# Patient Record
Sex: Female | Born: 1980 | Race: White | Hispanic: No | Smoking: Never smoker
Health system: Southern US, Community
[De-identification: ages and names within clinical notes are randomized; demographics above are authoritative.]

## PROBLEM LIST (undated history)

## (undated) DIAGNOSIS — K219 Gastro-esophageal reflux disease without esophagitis: Secondary | ICD-10-CM

## (undated) DIAGNOSIS — R030 Elevated blood-pressure reading, without diagnosis of hypertension: Secondary | ICD-10-CM

## (undated) DIAGNOSIS — IMO0001 Reserved for inherently not codable concepts without codable children: Secondary | ICD-10-CM

## (undated) DIAGNOSIS — G8929 Other chronic pain: Secondary | ICD-10-CM

## (undated) DIAGNOSIS — R51 Headache: Secondary | ICD-10-CM

## (undated) HISTORY — DX: Other chronic pain: G89.29

## (undated) HISTORY — DX: Headache: R51

## (undated) HISTORY — PX: TONSILLECTOMY: SUR1361

## (undated) HISTORY — PX: ADENOIDECTOMY: SUR15

## (undated) HISTORY — DX: Reserved for inherently not codable concepts without codable children: IMO0001

## (undated) HISTORY — DX: Elevated blood-pressure reading, without diagnosis of hypertension: R03.0

---

## 2013-08-18 ENCOUNTER — Other Ambulatory Visit: Payer: Self-pay | Admitting: Neurosurgery

## 2013-09-16 ENCOUNTER — Encounter (HOSPITAL_COMMUNITY): Payer: Self-pay | Admitting: Pharmacy Technician

## 2013-09-19 ENCOUNTER — Encounter (HOSPITAL_COMMUNITY): Payer: Self-pay

## 2013-09-19 ENCOUNTER — Encounter (HOSPITAL_COMMUNITY)
Admission: RE | Admit: 2013-09-19 | Discharge: 2013-09-19 | Disposition: A | Discharge status: Home / Self Care | Source: Ambulatory Visit | Attending: Neurosurgery | Admitting: Neurosurgery

## 2013-09-19 DIAGNOSIS — Z01812 Encounter for preprocedural laboratory examination: Secondary | ICD-10-CM | POA: Diagnosis not present

## 2013-09-19 HISTORY — DX: Gastro-esophageal reflux disease without esophagitis: K21.9

## 2013-09-19 LAB — CBC
HEMATOCRIT: 39.6 % (ref 36.0–46.0)
Hemoglobin: 13.3 g/dL (ref 12.0–15.0)
MCH: 29.1 pg (ref 26.0–34.0)
MCHC: 33.6 g/dL (ref 30.0–36.0)
MCV: 86.7 fL (ref 78.0–100.0)
PLATELETS: 222 10*3/uL (ref 150–400)
RBC: 4.57 MIL/uL (ref 3.87–5.11)
RDW: 13 % (ref 11.5–15.5)
WBC: 7 10*3/uL (ref 4.0–10.5)

## 2013-09-19 LAB — SURGICAL PCR SCREEN
MRSA, PCR: NEGATIVE
Staphylococcus aureus: NEGATIVE

## 2013-09-19 LAB — HCG, SERUM, QUALITATIVE: Preg, Serum: NEGATIVE

## 2013-09-19 NOTE — Pre-Procedure Instructions (Signed)
Melissa [name] ZOXWRUEundiff  09/19/2013   Your procedure is scheduled on:  September 30, 2013  [facility] Admitting at 5:30 AM.  Call this number if you have problems the morning of surgery: [phone]   Remember:   Do not eat food or drink liquids after midnight.   Take these medicines the morning of surgery with A SIP OF WATER: None   STOP HERBAL SUPPLEMENTS, NSAIDS (IBUPROFEN) ONE WEEK PRIOR TO SURGERY  STOP MEDICATIONS CONTAINING PSEUDOPHEDRINE (CLARITIN D 24)  2 WEEKS PRIOR TO SURGERY   Do not wear jewelry, make-up or nail polish.  Do not wear lotions, powders, or perfumes. You may wear deodorant.  Do not shave 48 hours prior to surgery.   Do not bring valuables to the hospital.  [facility] is not responsible for any belongings or valuables.               Contacts, dentures or bridgework may not be worn into surgery.  Leave suitcase in the car. After surgery it may be brought to your room.  For patients admitted to the hospital, discharge time is determined by your                treatment team.               Patients discharged the day of surgery will not be allowed to drive  home.  Name and phone number of your driver:     Please read over the following fact sheets that you were given: Pain Booklet, Coughing and Deep Breathing and Surgical Site Infection Prevention

## 2013-09-29 MED ORDER — CEFAZOLIN SODIUM-DEXTROSE 2-3 GM-% IV SOLR
2.0000 g | INTRAVENOUS | Status: AC
  Administered 2013-09-30: 2 g via INTRAVENOUS
  Filled 2013-09-29: qty 50

## 2013-09-30 ENCOUNTER — Encounter (HOSPITAL_COMMUNITY): Admitting: Anesthesiology

## 2013-09-30 ENCOUNTER — Encounter (HOSPITAL_COMMUNITY): Payer: Self-pay | Admitting: *Deleted

## 2013-09-30 ENCOUNTER — Ambulatory Visit (HOSPITAL_COMMUNITY)

## 2013-09-30 ENCOUNTER — Observation Stay (HOSPITAL_COMMUNITY)
Admission: RE | Admit: 2013-09-30 | Discharge: 2013-09-30 | Disposition: A | Discharge status: Home / Self Care | Source: Ambulatory Visit | Attending: Neurosurgery | Admitting: Neurosurgery

## 2013-09-30 ENCOUNTER — Encounter (HOSPITAL_COMMUNITY)
Admission: RE | Disposition: A | Discharge status: Home / Self Care | Payer: Self-pay | Source: Ambulatory Visit | Attending: Neurosurgery

## 2013-09-30 ENCOUNTER — Ambulatory Visit (HOSPITAL_COMMUNITY): Admitting: Anesthesiology

## 2013-09-30 DIAGNOSIS — M5010 Cervical disc disorder with radiculopathy, unspecified cervical region: Secondary | ICD-10-CM | POA: Diagnosis present

## 2013-09-30 DIAGNOSIS — M4712 Other spondylosis with myelopathy, cervical region: Secondary | ICD-10-CM | POA: Diagnosis present

## 2013-09-30 DIAGNOSIS — M5020 Other cervical disc displacement, unspecified cervical region: Secondary | ICD-10-CM | POA: Insufficient documentation

## 2013-09-30 DIAGNOSIS — K219 Gastro-esophageal reflux disease without esophagitis: Secondary | ICD-10-CM | POA: Insufficient documentation

## 2013-09-30 DIAGNOSIS — M47812 Spondylosis without myelopathy or radiculopathy, cervical region: Secondary | ICD-10-CM | POA: Diagnosis not present

## 2013-09-30 HISTORY — PX: CERVICAL DISC ARTHROPLASTY: SHX587

## 2013-09-30 SURGERY — CERVICAL ANTERIOR DISC ARTHROPLASTY
Anesthesia: General | Site: Neck

## 2013-09-30 MED ORDER — MEPERIDINE HCL 25 MG/ML IJ SOLN: 6.2500 mg | INTRAMUSCULAR | Status: DC | PRN

## 2013-09-30 MED ORDER — 0.9 % SODIUM CHLORIDE (POUR BTL) OPTIME
TOPICAL | Status: DC | PRN
  Administered 2013-09-30: 1000 mL

## 2013-09-30 MED ORDER — ONDANSETRON HCL 4 MG/2ML IJ SOLN: 4.0000 mg | INTRAMUSCULAR | Status: DC | PRN

## 2013-09-30 MED ORDER — DOCUSATE SODIUM 100 MG PO CAPS
100.0000 mg | ORAL_CAPSULE | Freq: Two times a day (BID) | ORAL | Status: DC
  Administered 2013-09-30: 100 mg via ORAL
  Filled 2013-09-30 (×2): qty 1

## 2013-09-30 MED ORDER — PHENOL 1.4 % MT LIQD: 1.0000 | OROMUCOSAL | Status: DC | PRN

## 2013-09-30 MED ORDER — KETOROLAC TROMETHAMINE 30 MG/ML IJ SOLN: 30.0000 mg | Freq: Four times a day (QID) | INTRAMUSCULAR | Status: DC

## 2013-09-30 MED ORDER — HYDROMORPHONE HCL PF 1 MG/ML IJ SOLN
INTRAMUSCULAR | Status: AC
  Filled 2013-09-30: qty 1

## 2013-09-30 MED ORDER — LIDOCAINE HCL (CARDIAC) 20 MG/ML IV SOLN
INTRAVENOUS | Status: AC
  Filled 2013-09-30: qty 5

## 2013-09-30 MED ORDER — ARTIFICIAL TEARS OP OINT
TOPICAL_OINTMENT | OPHTHALMIC | Status: DC | PRN
  Administered 2013-09-30: 1 via OPHTHALMIC

## 2013-09-30 MED ORDER — KETOROLAC TROMETHAMINE 30 MG/ML IJ SOLN
30.0000 mg | Freq: Once | INTRAMUSCULAR | Status: AC
  Administered 2013-09-30: 30 mg via INTRAVENOUS

## 2013-09-30 MED ORDER — LIDOCAINE-EPINEPHRINE 1 %-1:100000 IJ SOLN
INTRAMUSCULAR | Status: DC | PRN
  Administered 2013-09-30: 4 mL

## 2013-09-30 MED ORDER — LIDOCAINE HCL (CARDIAC) 20 MG/ML IV SOLN
INTRAVENOUS | Status: DC | PRN
  Administered 2013-09-30: 100 mg via INTRAVENOUS

## 2013-09-30 MED ORDER — MIDAZOLAM HCL 5 MG/5ML IJ SOLN
INTRAMUSCULAR | Status: DC | PRN
  Administered 2013-09-30: 2 mg via INTRAVENOUS

## 2013-09-30 MED ORDER — SODIUM CHLORIDE 0.9 % IJ SOLN: 3.0000 mL | Freq: Two times a day (BID) | INTRAMUSCULAR | Status: DC

## 2013-09-30 MED ORDER — MORPHINE SULFATE 2 MG/ML IJ SOLN: 1.0000 mg | INTRAMUSCULAR | Status: DC | PRN

## 2013-09-30 MED ORDER — HYDROMORPHONE HCL PF 1 MG/ML IJ SOLN
0.2500 mg | INTRAMUSCULAR | Status: DC | PRN
  Administered 2013-09-30 (×2): 0.5 mg via INTRAVENOUS

## 2013-09-30 MED ORDER — HYDROCODONE-ACETAMINOPHEN 5-325 MG PO TABS: 1.0000 | ORAL_TABLET | ORAL | Status: DC | PRN

## 2013-09-30 MED ORDER — IBUPROFEN 800 MG PO TABS
800.0000 mg | ORAL_TABLET | Freq: Three times a day (TID) | ORAL | Status: DC | PRN
  Filled 2013-09-30: qty 1

## 2013-09-30 MED ORDER — DEXAMETHASONE SODIUM PHOSPHATE 10 MG/ML IJ SOLN
INTRAMUSCULAR | Status: AC
  Filled 2013-09-30: qty 1

## 2013-09-30 MED ORDER — DIAZEPAM 5 MG PO TABS: 5.0000 mg | ORAL_TABLET | Freq: Four times a day (QID) | ORAL | Status: DC | PRN

## 2013-09-30 MED ORDER — ARTIFICIAL TEARS OP OINT
TOPICAL_OINTMENT | OPHTHALMIC | Status: AC
  Filled 2013-09-30: qty 3.5

## 2013-09-30 MED ORDER — OXYCODONE HCL 5 MG/5ML PO SOLN: 5.0000 mg | Freq: Once | ORAL | Status: DC | PRN

## 2013-09-30 MED ORDER — MIDAZOLAM HCL 2 MG/2ML IJ SOLN
INTRAMUSCULAR | Status: AC
  Filled 2013-09-30: qty 2

## 2013-09-30 MED ORDER — GLYCOPYRROLATE 0.2 MG/ML IJ SOLN
INTRAMUSCULAR | Status: DC | PRN
  Administered 2013-09-30: 0.6 mg via INTRAVENOUS

## 2013-09-30 MED ORDER — HYDROMORPHONE HCL PF 1 MG/ML IJ SOLN
INTRAMUSCULAR | Status: AC
  Administered 2013-09-30: 0.5 mg via INTRAVENOUS
  Filled 2013-09-30: qty 1

## 2013-09-30 MED ORDER — SENNA 8.6 MG PO TABS: 1.0000 | ORAL_TABLET | Freq: Two times a day (BID) | ORAL | Status: DC

## 2013-09-30 MED ORDER — CEFAZOLIN SODIUM 1-5 GM-% IV SOLN
1.0000 g | Freq: Three times a day (TID) | INTRAVENOUS | Status: DC
  Administered 2013-09-30: 1 g via INTRAVENOUS
  Filled 2013-09-30: qty 50

## 2013-09-30 MED ORDER — PHENYLEPHRINE 40 MCG/ML (10ML) SYRINGE FOR IV PUSH (FOR BLOOD PRESSURE SUPPORT)
PREFILLED_SYRINGE | INTRAVENOUS | Status: AC
  Filled 2013-09-30: qty 10

## 2013-09-30 MED ORDER — HEMOSTATIC AGENTS (NO CHARGE) OPTIME
TOPICAL | Status: DC | PRN
  Administered 2013-09-30: 1 via TOPICAL

## 2013-09-30 MED ORDER — SENNOSIDES-DOCUSATE SODIUM 8.6-50 MG PO TABS
1.0000 | ORAL_TABLET | Freq: Every evening | ORAL | Status: DC | PRN
  Filled 2013-09-30: qty 1

## 2013-09-30 MED ORDER — GLYCOPYRROLATE 0.2 MG/ML IJ SOLN
INTRAMUSCULAR | Status: AC
  Filled 2013-09-30: qty 3

## 2013-09-30 MED ORDER — ONDANSETRON HCL 4 MG/2ML IJ SOLN: 4.0000 mg | Freq: Once | INTRAMUSCULAR | Status: DC | PRN

## 2013-09-30 MED ORDER — DEXAMETHASONE SODIUM PHOSPHATE 10 MG/ML IJ SOLN
INTRAMUSCULAR | Status: DC | PRN
  Administered 2013-09-30: 10 mg via INTRAVENOUS

## 2013-09-30 MED ORDER — LORATADINE 10 MG PO TABS
10.0000 mg | ORAL_TABLET | Freq: Every day | ORAL | Status: DC
  Administered 2013-09-30: 10 mg via ORAL
  Filled 2013-09-30: qty 1

## 2013-09-30 MED ORDER — FENTANYL CITRATE 0.05 MG/ML IJ SOLN
INTRAMUSCULAR | Status: AC
  Filled 2013-09-30: qty 5

## 2013-09-30 MED ORDER — SUCCINYLCHOLINE CHLORIDE 20 MG/ML IJ SOLN
INTRAMUSCULAR | Status: AC
  Filled 2013-09-30: qty 1

## 2013-09-30 MED ORDER — PROPOFOL 10 MG/ML IV BOLUS
INTRAVENOUS | Status: DC | PRN
  Administered 2013-09-30: 120 mg via INTRAVENOUS

## 2013-09-30 MED ORDER — EPHEDRINE SULFATE 50 MG/ML IJ SOLN
INTRAMUSCULAR | Status: AC
  Filled 2013-09-30: qty 1

## 2013-09-30 MED ORDER — OXYCODONE-ACETAMINOPHEN 5-325 MG PO TABS
ORAL_TABLET | ORAL | Status: AC
  Filled 2013-09-30: qty 2

## 2013-09-30 MED ORDER — SODIUM CHLORIDE 0.9 % IJ SOLN
INTRAMUSCULAR | Status: AC
  Filled 2013-09-30: qty 10

## 2013-09-30 MED ORDER — B COMPLEX-C PO TABS
2.0000 | ORAL_TABLET | Freq: Every day | ORAL | Status: DC
  Administered 2013-09-30: 2 via ORAL
  Filled 2013-09-30: qty 2

## 2013-09-30 MED ORDER — ALUM & MAG HYDROXIDE-SIMETH 200-200-20 MG/5ML PO SUSP: 30.0000 mL | Freq: Four times a day (QID) | ORAL | Status: DC | PRN

## 2013-09-30 MED ORDER — FENTANYL CITRATE 0.05 MG/ML IJ SOLN
INTRAMUSCULAR | Status: DC | PRN
  Administered 2013-09-30: 100 ug via INTRAVENOUS
  Administered 2013-09-30: 50 ug via INTRAVENOUS
  Administered 2013-09-30: 100 ug via INTRAVENOUS

## 2013-09-30 MED ORDER — OXYCODONE HCL 5 MG PO TABS: 5.0000 mg | ORAL_TABLET | Freq: Once | ORAL | Status: DC | PRN

## 2013-09-30 MED ORDER — ONDANSETRON HCL 4 MG/2ML IJ SOLN
INTRAMUSCULAR | Status: DC | PRN
  Administered 2013-09-30: 4 mg via INTRAVENOUS

## 2013-09-30 MED ORDER — PROPOFOL 10 MG/ML IV BOLUS
INTRAVENOUS | Status: AC
  Filled 2013-09-30: qty 20

## 2013-09-30 MED ORDER — SODIUM CHLORIDE 0.9 % IV SOLN: 250.0000 mL | INTRAVENOUS | Status: DC

## 2013-09-30 MED ORDER — KETOROLAC TROMETHAMINE 30 MG/ML IJ SOLN
INTRAMUSCULAR | Status: AC
  Administered 2013-09-30: 30 mg via INTRAVENOUS
  Filled 2013-09-30: qty 1

## 2013-09-30 MED ORDER — BUPIVACAINE HCL (PF) 0.25 % IJ SOLN
INTRAMUSCULAR | Status: DC | PRN
  Administered 2013-09-30: 4 mL

## 2013-09-30 MED ORDER — NEOSTIGMINE METHYLSULFATE 10 MG/10ML IV SOLN
INTRAVENOUS | Status: DC | PRN
  Administered 2013-09-30: 4 mg via INTRAVENOUS

## 2013-09-30 MED ORDER — ROCURONIUM BROMIDE 100 MG/10ML IV SOLN
INTRAVENOUS | Status: DC | PRN
  Administered 2013-09-30: 50 mg via INTRAVENOUS

## 2013-09-30 MED ORDER — PANTOPRAZOLE SODIUM 40 MG IV SOLR
40.0000 mg | Freq: Every day | INTRAVENOUS | Status: DC
  Filled 2013-09-30: qty 40

## 2013-09-30 MED ORDER — FLEET ENEMA 7-19 GM/118ML RE ENEM
1.0000 | ENEMA | Freq: Once | RECTAL | Status: DC | PRN
  Filled 2013-09-30: qty 1

## 2013-09-30 MED ORDER — MENTHOL 3 MG MT LOZG: 1.0000 | LOZENGE | OROMUCOSAL | Status: DC | PRN

## 2013-09-30 MED ORDER — NEOSTIGMINE METHYLSULFATE 10 MG/10ML IV SOLN
INTRAVENOUS | Status: AC
  Filled 2013-09-30: qty 1

## 2013-09-30 MED ORDER — LACTATED RINGERS IV SOLN
INTRAVENOUS | Status: DC | PRN
  Administered 2013-09-30 (×2): via INTRAVENOUS

## 2013-09-30 MED ORDER — SODIUM CHLORIDE 0.9 % IJ SOLN
3.0000 mL | INTRAMUSCULAR | Status: DC | PRN
Start: 2013-09-30 — End: 2013-09-30

## 2013-09-30 MED ORDER — BISACODYL 10 MG RE SUPP: 10.0000 mg | Freq: Every day | RECTAL | Status: DC | PRN

## 2013-09-30 MED ORDER — ZOLPIDEM TARTRATE 5 MG PO TABS: 5.0000 mg | ORAL_TABLET | Freq: Every evening | ORAL | Status: DC | PRN

## 2013-09-30 MED ORDER — ONDANSETRON HCL 4 MG/2ML IJ SOLN
INTRAMUSCULAR | Status: AC
  Filled 2013-09-30: qty 2

## 2013-09-30 MED ORDER — THROMBIN 5000 UNITS EX SOLR
CUTANEOUS | Status: DC | PRN
  Administered 2013-09-30 (×2): 5000 [IU] via TOPICAL

## 2013-09-30 MED ORDER — KCL IN DEXTROSE-NACL 20-5-0.45 MEQ/L-%-% IV SOLN
INTRAVENOUS | Status: DC
  Filled 2013-09-30 (×2): qty 1000

## 2013-09-30 MED ORDER — ROCURONIUM BROMIDE 50 MG/5ML IV SOLN
INTRAVENOUS | Status: AC
  Filled 2013-09-30: qty 1

## 2013-09-30 MED ORDER — OXYCODONE-ACETAMINOPHEN 5-325 MG PO TABS
1.0000 | ORAL_TABLET | ORAL | Status: DC | PRN
  Administered 2013-09-30 (×2): 2 via ORAL
  Filled 2013-09-30: qty 2

## 2013-09-30 MED ORDER — ACETAMINOPHEN 325 MG PO TABS: 650.0000 mg | ORAL_TABLET | ORAL | Status: DC | PRN

## 2013-09-30 MED ORDER — B COMPLEX PO TABS: 2.0000 | ORAL_TABLET | Freq: Every day | ORAL | Status: DC

## 2013-09-30 MED ORDER — ACETAMINOPHEN ER 650 MG PO TBCR: 650.0000 mg | EXTENDED_RELEASE_TABLET | Freq: Three times a day (TID) | ORAL | Status: DC | PRN

## 2013-09-30 MED ORDER — ACETAMINOPHEN 650 MG RE SUPP: 650.0000 mg | RECTAL | Status: DC | PRN

## 2013-09-30 SURGICAL SUPPLY — 70 items
BAG DECANTER FOR FLEXI CONT (MISCELLANEOUS) ×3 IMPLANT
BENZOIN TINCTURE PRP APPL 2/3 (GAUZE/BANDAGES/DRESSINGS) IMPLANT
BIT DRILL NEURO 2X3.1 SFT TUCH (MISCELLANEOUS) ×1 IMPLANT
BIT MILLING PRODISC 2.0 STER (BIT) ×6 IMPLANT
BLADE 10 SAFETY STRL DISP (BLADE) IMPLANT
BLADE ULTRA TIP 2M (BLADE) ×3 IMPLANT
BUR BARREL STRAIGHT FLUTE 4.0 (BURR) IMPLANT
CANISTER SUCT 3000ML (MISCELLANEOUS) ×3 IMPLANT
CLOSURE WOUND 1/2 X4 (GAUZE/BANDAGES/DRESSINGS)
CONT SPEC 4OZ CLIKSEAL STRL BL (MISCELLANEOUS) ×3 IMPLANT
DERMABOND ADHESIVE PROPEN (GAUZE/BANDAGES/DRESSINGS) ×2
DERMABOND ADVANCED (GAUZE/BANDAGES/DRESSINGS) ×2
DERMABOND ADVANCED .7 DNX12 (GAUZE/BANDAGES/DRESSINGS) ×1 IMPLANT
DERMABOND ADVANCED .7 DNX6 (GAUZE/BANDAGES/DRESSINGS) ×1 IMPLANT
DISC PRODISC-C MED DEEP 5MM (Neuro Prosthesis/Implant) ×3 IMPLANT
DRAPE C-ARM 42X72 X-RAY (DRAPES) IMPLANT
DRAPE LAPAROTOMY 100X72 PEDS (DRAPES) ×3 IMPLANT
DRAPE MICROSCOPE LEICA (MISCELLANEOUS) ×3 IMPLANT
DRAPE POUCH INSTRU U-SHP 10X18 (DRAPES) ×3 IMPLANT
DRAPE PROXIMA HALF (DRAPES) IMPLANT
DRILL NEURO 2X3.1 SOFT TOUCH (MISCELLANEOUS) ×3
DRSG OPSITE POSTOP 3X4 (GAUZE/BANDAGES/DRESSINGS) ×3 IMPLANT
DRSG TELFA 3X8 NADH (GAUZE/BANDAGES/DRESSINGS) IMPLANT
DURAPREP 6ML APPLICATOR 50/CS (WOUND CARE) ×3 IMPLANT
ELECT COATED BLADE 2.86 ST (ELECTRODE) ×3 IMPLANT
ELECT REM PT RETURN 9FT ADLT (ELECTROSURGICAL) ×3
ELECTRODE REM PT RTRN 9FT ADLT (ELECTROSURGICAL) ×1 IMPLANT
GAUZE SPONGE 4X4 16PLY XRAY LF (GAUZE/BANDAGES/DRESSINGS) IMPLANT
GLOVE BIO SURGEON STRL SZ8 (GLOVE) ×3 IMPLANT
GLOVE BIOGEL PI IND STRL 7.5 (GLOVE) ×1 IMPLANT
GLOVE BIOGEL PI IND STRL 8 (GLOVE) ×1 IMPLANT
GLOVE BIOGEL PI IND STRL 8.5 (GLOVE) ×1 IMPLANT
GLOVE BIOGEL PI INDICATOR 7.5 (GLOVE) ×2
GLOVE BIOGEL PI INDICATOR 8 (GLOVE) ×2
GLOVE BIOGEL PI INDICATOR 8.5 (GLOVE) ×2
GLOVE ECLIPSE 7.5 STRL STRAW (GLOVE) ×3 IMPLANT
GLOVE EXAM NITRILE LRG STRL (GLOVE) IMPLANT
GLOVE EXAM NITRILE MD LF STRL (GLOVE) IMPLANT
GLOVE EXAM NITRILE XL STR (GLOVE) IMPLANT
GLOVE EXAM NITRILE XS STR PU (GLOVE) IMPLANT
GLOVE SS N UNI LF 7.5 STRL (GLOVE) ×6 IMPLANT
GOWN STRL REUS W/ TWL LRG LVL3 (GOWN DISPOSABLE) IMPLANT
GOWN STRL REUS W/ TWL XL LVL3 (GOWN DISPOSABLE) ×2 IMPLANT
GOWN STRL REUS W/TWL 2XL LVL3 (GOWN DISPOSABLE) ×3 IMPLANT
GOWN STRL REUS W/TWL LRG LVL3 (GOWN DISPOSABLE)
GOWN STRL REUS W/TWL XL LVL3 (GOWN DISPOSABLE) ×4
KIT BASIN OR (CUSTOM PROCEDURE TRAY) ×3 IMPLANT
KIT ROOM TURNOVER OR (KITS) ×3 IMPLANT
NEEDLE HYPO 25X1 1.5 SAFETY (NEEDLE) ×3 IMPLANT
NEEDLE SPNL 22GX3.5 QUINCKE BK (NEEDLE) ×3 IMPLANT
NS IRRIG 1000ML POUR BTL (IV SOLUTION) ×3 IMPLANT
PACK LAMINECTOMY NEURO (CUSTOM PROCEDURE TRAY) ×3 IMPLANT
PAD ABD 8X10 STRL (GAUZE/BANDAGES/DRESSINGS) IMPLANT
PAD ARMBOARD 7.5X6 YLW CONV (MISCELLANEOUS) ×3 IMPLANT
PIN RETAINER PRODISC 14 MM (PIN) ×6 IMPLANT
Pro Disc Retainer Screw 14mm ×6 IMPLANT
RUBBERBAND STERILE (MISCELLANEOUS) IMPLANT
SPONGE GAUZE 4X4 12PLY (GAUZE/BANDAGES/DRESSINGS) IMPLANT
SPONGE INTESTINAL PEANUT (DISPOSABLE) ×3 IMPLANT
SPONGE SURGIFOAM ABS GEL SZ50 (HEMOSTASIS) ×3 IMPLANT
STRIP CLOSURE SKIN 1/2X4 (GAUZE/BANDAGES/DRESSINGS) IMPLANT
SUT VIC AB 3-0 SH 8-18 (SUTURE) ×3 IMPLANT
SYR 20ML ECCENTRIC (SYRINGE) ×3 IMPLANT
TAPE CLOTH 3X10 TAN LF (GAUZE/BANDAGES/DRESSINGS) ×3 IMPLANT
TAPE STRIPS DRAPE STRL (GAUZE/BANDAGES/DRESSINGS) ×3 IMPLANT
TIP INSERTER MEDIUM (INSTRUMENTS) ×6 IMPLANT
TOWEL OR 17X24 6PK STRL BLUE (TOWEL DISPOSABLE) ×3 IMPLANT
TOWEL OR 17X26 10 PK STRL BLUE (TOWEL DISPOSABLE) ×3 IMPLANT
TRAP SPECIMEN MUCOUS 40CC (MISCELLANEOUS) IMPLANT
WATER STERILE IRR 1000ML POUR (IV SOLUTION) ×3 IMPLANT

## 2013-09-30 NOTE — Interval H&P Note (Signed)
History and Physical Interval Note:  09/30/2013 7:30 AM  Lina SayreNicole Almanza  has presented today for surgery, with the diagnosis of Cervical radiculopathy, spondylosis, stenosis  The various methods of treatment have been discussed with the patient and family. After consideration of risks, benefits and other options for treatment, the patient has consented to  Procedure(s) with comments: CERVICAL ANTERIOR DISC ARTHROPLASTY (N/A) - C6-7 Cervical artificial disc replacement as a surgical intervention .  The patient's history has been reviewed, patient examined, no change in status, stable for surgery.  I have reviewed the patient's chart and labs.  Questions were answered to the patient's satisfaction.     Deloise Marchant D

## 2013-09-30 NOTE — Transfer of Care (Signed)
Immediate Anesthesia Transfer of Care Note  Patient: Melissa [name]  Procedure(s) Performed: Procedure(s) with comments: CERVICAL ANTERIOR DISC ARTHROPLASTY (N/A) - C6-7 Cervical artificial disc replacement  Patient Location: PACU  Anesthesia Type:General  Level of Consciousness: awake, alert , oriented and sedated  Airway & Oxygen Therapy: Patient Spontanous Breathing and Patient connected to nasal cannula oxygen  Post-op Assessment: Report given to PACU RN, Post -op Vital signs reviewed and stable and Patient moving all extremities  Post vital signs: Reviewed and stable  Complications: No apparent anesthesia complications

## 2013-09-30 NOTE — Op Note (Signed)
09/30/2013  9:40 AM  PATIENT:  Melissa [name]  33 y.o. 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  PROCEDURE:  Procedure(s) with comments: CERVICAL ANTERIOR DISC ARTHROPLASTY (N/A) - C6-7 Cervical artificial disc replacement with Prodisc-C disc arthroplasty  SURGEON:  Surgeon(s) and Role:    * Bethani Brugger, MD - Primary  PHYSICIAN ASSISTANT:   ASSISTANTS: Poteat, RN  ANESTHESIA:   general  EBL:  Total I/O In: 1000 [I.V.:1000] Out: -   BLOOD ADMINISTERED:none  DRAINS: none   LOCAL MEDICATIONS USED:  LIDOCAINE   SPECIMEN:  No Specimen  DISPOSITION OF SPECIMEN:  N/A  COUNTS:  YES  TOURNIQUET:  * No tourniquets in log *  DICTATION: Patient was brought to operating room and following the smooth and uncomplicated induction of general endotracheal anesthesia her head was placed on a donut head holder and her anterior neck was prepped and draped in usual sterile fashion. Shoulders were padded and taped to allow for x-ray visualization.  An incision was made on the left side of midline after infiltrating the skin and subcutaneous tissues with local lidocaine. The platysmal layer was incised and subplatysmal dissection was performed exposing the anterior border sternocleidomastoid muscle. Using blunt dissection the carotid sheath was kept lateral and trachea and esophagus kept medial exposing the anterior cervical spine. A bent spinal needle was placed it was felt to be the C6-7 level and this was confirmed on intraoperative x-ray. Longus coli muscles were taken down from the anterior cervical spine using electrocautery and key elevator and self-retaining retractor was placed. The interspace was incised and a thorough discectomy was performed. Synthes 14 mm Distraction pins were placed. The spinal cord dura and both  C7 nerve roots were widely decompressed. A large ruptured disc was removed which was compressing the left C 7 nerve root.  Hemostasis was assured. After trial sizing a medium deep 5 mm Prodisc C implant was sized and countersunk aoppropriately. The milling guide was placed and keel cuts were made.  The chisel was used to clear the keel cuts.  A medium deep Prodisc C implant was placed.  It's positioning was confirmed with AP and lateral fluoroscopy.  Keel cuts were waxed.  Soft tissues were inspected and found to be in good repair. The wound was irrigated. The platysma layer was closed with 3-0 Vicryl stitches and the skin was reapproximated with 3-0 Vicryl subcuticular stitches. The wound was dressed with Dermabond. Counts were correct at the end of the case. Patient was extubated and taken to recovery in stable and satisfactory condition.  PLAN OF CARE: Admit for overnight observation  PATIENT DISPOSITION:  PACU - hemodynamically stable.   Delay start of Pharmacological VTE agent (>24hrs) due to surgical blood loss or risk of bleeding: yes  

## 2013-09-30 NOTE — Discharge Summary (Signed)
Physician Discharge Summary  Patient ID: Melissa [name] MRN: [id number] DOB/AGE: 33/11/1980 33 y.o.  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 rate 18, weight 83.915 kg (185 lb), SpO2 93.00%. Neurologic: Alert and oriented X 3, normal strength and tone. Normal symmetric reflexes. Normal coordination and gait Wound:CDI  Disposition: Home     Medication List         acetaminophen 650 MG CR tablet  Commonly known as:  TYLENOL  Take 650 mg by mouth every 8 (eight) hours as needed for pain.     b complex vitamins tablet  Take 2 tablets by mouth daily.     CALCIUM PO  Take 1 tablet by mouth daily.     CLARITIN-[name] 24 HOUR PO  Take 1 tablet by mouth daily.     ibuprofen 800 MG tablet  Commonly known as:  ADVIL,MOTRIN  Take 800 mg by mouth every 8 (eight) hours as needed for moderate pain.     VITAMIN [name] PO  Take 1 tablet by mouth daily.         Signed: Dorian [name],Melissa Oconnor D, MD 09/30/2013, 3:07 PM

## 2013-09-30 NOTE — Progress Notes (Signed)
Pt doing well. Pt and family given D/C instructions with verbal understanding of teaching. Pt's IV was removed prior to D/C. Pt D/C'd home via wheelchair @ 1630 per MD order. Pt is stable @ D/C and has no other needs at this time. Rema FendtAshley Devoiry Corriher, RN

## 2013-09-30 NOTE — Anesthesia Postprocedure Evaluation (Signed)
Anesthesia Post Note  Patient: Melissa [name]  Procedure(s) Performed: Procedure(s) (LRB): CERVICAL ANTERIOR DISC ARTHROPLASTY (N/A)  Anesthesia type: general  Patient location: PACU  Post pain: Pain level controlled  Post assessment: Patient's Cardiovascular Status Stable  Last Vitals:  Filed Vitals:   09/30/13 1000  BP: 134/99  Pulse: 63  Temp:   Resp: 11    Post vital signs: Reviewed and stable  Level of consciousness: sedated  Complications: No apparent anesthesia complications

## 2013-09-30 NOTE — Anesthesia Preprocedure Evaluation (Signed)
Anesthesia Evaluation  Patient identified by MRN, date of birth, ID band Patient awake    Reviewed: Allergy & Precautions, H&P , NPO status , Patient's Chart, lab work & pertinent test results  Airway Mallampati: I TM Distance: >3 FB Neck ROM: Full    Dental   Pulmonary          Cardiovascular     Neuro/Psych    GI/Hepatic GERD-  Medicated and Controlled,  Endo/Other    Renal/GU      Musculoskeletal   Abdominal   Peds  Hematology   Anesthesia Other Findings   Reproductive/Obstetrics                           Anesthesia Physical Anesthesia Plan  ASA: II  Anesthesia Plan: General   Post-op Pain Management:    Induction: Intravenous  Airway Management Planned: Oral ETT  Additional Equipment:   Intra-op Plan:   Post-operative Plan: Extubation in OR  Informed Consent: I have reviewed the patients History and Physical, chart, labs and discussed the procedure including the risks, benefits and alternatives for the proposed anesthesia with the patient or authorized representative who has indicated his/her understanding and acceptance.     Plan Discussed with: CRNA and Surgeon  Anesthesia Plan Comments:         Anesthesia Quick Evaluation  
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
## 2013-09-30 NOTE — Brief Op Note (Signed)
09/30/2013  9:40 AM  PATIENT:  Melissa [name]  33 y.o. 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  PROCEDURE:  Procedure(s) with comments: CERVICAL ANTERIOR DISC ARTHROPLASTY (N/A) - C6-7 Cervical artificial disc replacement with Prodisc-C disc arthroplasty  SURGEON:  Surgeon(s) and Role:    * Maeola HarmanJoseph Jamine Highfill, MD - Primary  PHYSICIAN ASSISTANT:   ASSISTANTS: Poteat, RN  ANESTHESIA:   general  EBL:  Total I/O In: 1000 [I.V.:1000] Out: -   BLOOD ADMINISTERED:none  DRAINS: none   LOCAL MEDICATIONS USED:  LIDOCAINE   SPECIMEN:  No Specimen  DISPOSITION OF SPECIMEN:  N/A  COUNTS:  YES  TOURNIQUET:  * No tourniquets in log *  DICTATION: Patient was brought to operating room and following the smooth and uncomplicated induction of general endotracheal anesthesia her head was placed on a donut head holder and her anterior neck was prepped and draped in usual sterile fashion. Shoulders were padded and taped to allow for x-ray visualization.  An incision was made on the left side of midline after infiltrating the skin and subcutaneous tissues with local lidocaine. The platysmal layer was incised and subplatysmal dissection was performed exposing the anterior border sternocleidomastoid muscle. Using blunt dissection the carotid sheath was kept lateral and trachea and esophagus kept medial exposing the anterior cervical spine. A bent spinal needle was placed it was felt to be the C6-7 level and this was confirmed on intraoperative x-ray. Longus coli muscles were taken down from the anterior cervical spine using electrocautery and key elevator and self-retaining retractor was placed. The interspace was incised and a thorough discectomy was performed. Synthes 14 mm Distraction pins were placed. The spinal cord dura and both  C7 nerve roots were widely decompressed. A large ruptured disc was removed which was compressing the left C 7 nerve root.  Hemostasis was assured. After trial sizing a medium deep 5 mm Prodisc C implant was sized and countersunk aoppropriately. The milling guide was placed and keel cuts were made.  The chisel was used to clear the keel cuts.  A medium deep Prodisc C implant was placed.  It's positioning was confirmed with AP and lateral fluoroscopy.  Keel cuts were waxed.  Soft tissues were inspected and found to be in good repair. The wound was irrigated. The platysma layer was closed with 3-0 Vicryl stitches and the skin was reapproximated with 3-0 Vicryl subcuticular stitches. The wound was dressed with Dermabond. Counts were correct at the end of the case. Patient was extubated and taken to recovery in stable and satisfactory condition.  PLAN OF CARE: Admit for overnight observation  PATIENT DISPOSITION:  PACU - hemodynamically stable.   Delay start of Pharmacological VTE agent (>24hrs) due to surgical blood loss or risk of bleeding: yes

## 2013-09-30 NOTE — Discharge Instructions (Signed)

## 2013-09-30 NOTE — Progress Notes (Signed)
Awake, alert, conversant.  MAEW with good strength.  Doing well. 

## 2013-09-30 NOTE — Anesthesia Procedure Notes (Signed)
Procedure Name: Intubation Date/Time: 09/30/2013 7:43 AM Performed by: Fransisca KaufmannMEYER, Kashaun Bebo E Pre-anesthesia Checklist: Patient identified, Emergency Drugs available, Suction available, Timeout performed and Patient being monitored Patient Re-evaluated:Patient Re-evaluated prior to inductionOxygen Delivery Method: Circle system utilized Preoxygenation: Pre-oxygenation with 100% oxygen Intubation Type: IV induction Ventilation: Mask ventilation without difficulty Laryngoscope Size: Miller and 2 Grade View: Grade I Tube type: Oral Tube size: 7.5 mm Number of attempts: 1 Airway Equipment and Method: Stylet Placement Confirmation: ETT inserted through vocal cords under direct vision,  positive ETCO2 and breath sounds checked- equal and bilateral Secured at: 21 cm Tube secured with: Tape Dental Injury: Teeth and Oropharynx as per pre-operative assessment

## 2013-10-01 NOTE — Progress Notes (Signed)
Utilization review completed.  

## 2013-10-02 ENCOUNTER — Encounter (HOSPITAL_COMMUNITY): Payer: Self-pay | Admitting: Neurosurgery

## 2015-05-21 ENCOUNTER — Encounter: Payer: Self-pay | Admitting: Allergy and Immunology

## 2015-05-21 ENCOUNTER — Ambulatory Visit (INDEPENDENT_AMBULATORY_CARE_PROVIDER_SITE_OTHER): Payer: PRIVATE HEALTH INSURANCE | Admitting: Allergy and Immunology

## 2015-05-21 VITALS — BP 140/88 | HR 86 | Temp 98.1°F | Resp 16 | Ht 66.93 in | Wt 225.8 lb

## 2015-05-21 DIAGNOSIS — H1010 Acute atopic conjunctivitis, unspecified eye: Secondary | ICD-10-CM | POA: Diagnosis not present

## 2015-05-21 DIAGNOSIS — R059 Cough, unspecified: Secondary | ICD-10-CM

## 2015-05-21 DIAGNOSIS — J309 Allergic rhinitis, unspecified: Secondary | ICD-10-CM

## 2015-05-21 DIAGNOSIS — R05 Cough: Secondary | ICD-10-CM

## 2015-05-21 MED ORDER — OLOPATADINE HCL 0.6 % NA SOLN: NASAL | Status: AC

## 2015-05-21 MED ORDER — CETIRIZINE HCL 10 MG PO TABS: ORAL_TABLET | ORAL | Status: DC

## 2015-05-21 NOTE — Progress Notes (Signed)
NEW PATIENT NOTE  RE: Melissa [name] MRN: [id number] DOB: 03/26/1980 [facility] [place] [address] [place] [id number] Date of Office Visit: 05/21/2015  Dear Aniceto BossPaul C Settle, MD:  I had the pleasure of seeing Melissa [name]  today in initial evaluation as you recall-- Subjective:  Melissa [name] is a 35 y.o. female who presents today for Cough; Nasal Congestion; and Sinusitis  Assessment:   1. Cough (normal lung exam and in office spirometry)  secondary to recent clinical sinusitis, completing Omnicef.    2. Allergic rhinoconjunctivitis.   3.      Chronic headaches with daily ibuprofen use, patient report of migraine, history. 4.      Elevated blood pressure. 5.      Patient report of following recent thyroid studies. 6.      Overweight. 7.      Negative selective food testing. Plan:   Meds ordered this encounter  Medications  . Olopatadine HCl 0.6 % SOLN    Sig: Use 2 sprays in each nostril once daily in the evening.    Dispense:  1 Bottle    Refill:  5  . cetirizine (ZYRTEC) 10 MG tablet    Sig: Take one tablet daily for runny nose or drainage.    Dispense:  30 tablet    Refill:  5   Patient Instructions  1. Avoidance: Mite, Mold and Pollen 2. Antihistamine: Zyrtec 10mg  by mouth once daily for runny nose or itching. 3. Nasal Spray: Flonase 2 spray(s) each nostril once daily for stuffy nose or drainage.  4. Continue Singulair 10mg  each evening. 5. Eye Drops: Zaditor one drop(s) each eye twice daily for itchy eyes. 6. Patanase 2 sprays each nostril each evening. 7. Nasal Saline wash prior to medicated nasal sprays each morning and evening. 8. Consider Sinus CT scan as discussed. 9.  Information on allergy injections. 10.  Review recurring use of NSAIDs --- work towards different option.  Consider referral to neurology for chronic headache management. 11.  Follow up Visit:  6 weeks or sooner if needed.  HPI: Melissa [name], who has a history of chronic  headaches presents with a 25 year history of recurring upper respiratory symptoms.  She describes rhinorrhea, congestion, sneezing, itchy watery eyes, postnasal drip and intermittent cough.  She feels her symptoms are year-round but more prominent with seasonal changes especially in the spring.  Pollen, dust, cat, mold, outdoor, strong odor/perfumes appear to be significant provoking factors to her symptoms.  She has had greater difficulty this past November, January and March with diagnosis of sinusitis, now completing her third course of antibiotics.  She reports receiving cortisone injection and starting maintenance medications which have been partially beneficial.  She did see ENT as a child but not recently, nor had chest x-ray or sinus CT scan.  She denies exercise or specific nocturnal symptoms but does snore.  Her only recollection is bronchitis as a child, none recently, nor history of pneumonia.  She has received antibiotics several times this year and mucus color seems to be clear after completing including currently. She avoids citrus, caffeine and chocolate as this seems to trigger migraines, but is taking ibuprofen daily which has some relationship to neck pain and headache.  She does minimize dairy as it appears to trigger reflux symptoms.  She denies any difficulty with hives or acute food-related reactions.  No history of tick bites. Denies ED or Urgent care visits.  Medical History: Past Medical History  Diagnosis Date  .  GERD (gastroesophageal reflux disease)   . Chronic headaches   . Elevated blood pressure    Surgical History: Past Surgical History  Procedure Laterality Date  . Tonsillectomy    . Cesarean section    . Cervical disc arthroplasty N/A 09/30/2013    Procedure: CERVICAL ANTERIOR DISC ARTHROPLASTY;  Surgeon: Maeola Harman, MD;  Location: [facility];  Service: Neurosurgery;  Laterality: N/A;  C6-7 Cervical artificial disc replacement  . Adenoidectomy     Family  History: Family History  Problem Relation Age of Onset  . Asthma Brother   . Allergic rhinitis Brother   . Asthma Maternal Uncle   . Angioedema Neg Hx   . Eczema Neg Hx   . Immunodeficiency Neg Hx   . Urticaria Mother   . Allergic rhinitis Father   . Migraines Paternal Grandmother   . Allergic rhinitis Son   . Allergic rhinitis Daughter    Social History: Social History  . Marital Status: Divorced    Spouse Name: N/A  . Number of Children: 2  . Years of Education: N/A   Social History Main Topics  . Smoking status: Never Smoker   . Smokeless tobacco: Not on file  . Alcohol Use: Not on file  . Drug Use: Not on file  . 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  Keosha has a current medication list which includes the following prescription(s): b complex vitamins, cefdinir, cetirizine, fexofenadine, fluticasone, ibuprofen, montelukast.  Drug Allergies: No known drug allergies. Allergies  Allergen Reactions  . Caffeine Other (See Comments)    Headache   . Other Other (See Comments)    Citrus causes migraines   Environmental History: Brinae lives in a 35 year old house for or years with carpet/wood floors, with central/wood heat and central air; stuffed mattress, non-feather pillow/comforter with without humidifier, pets and smokers.   Review of Systems  Constitutional: Negative for fever, weight loss and malaise/fatigue.  HENT: Positive for congestion. Negative for ear pain, hearing loss, nosebleeds and sore throat.   Eyes: Negative for blurred vision, double vision, discharge and redness.       Corrective contact lenses.  Respiratory: Positive for cough. Negative for hemoptysis, sputum production, shortness of breath and wheezing.        Denies history of bronchitis and pneumonia.  Cardiovascular: Negative for chest pain.  Gastrointestinal: Positive for  heartburn (intermittent). Negative for nausea, vomiting, abdominal pain, diarrhea and constipation.  Genitourinary: Negative for hematuria.  Musculoskeletal: Negative for myalgias and joint pain.  Skin: Positive for itching (occasional). Negative for rash.  Neurological: Positive for headaches (reports migraines). Negative for dizziness, tingling, tremors, seizures and weakness.  Endo/Heme/Allergies: Positive for environmental allergies.       Denies sensitivity to aspirin, NSAIDs, stinging insects, foods, latex, jewelry and cosmetics.  Immunological: No chronic or recurring infections. Objective:   Filed Vitals:   05/21/15 1240  BP: 140/88  Pulse: 86  Temp: 98.1 F (36.7 C)  Resp: 16   SpO2 Readings from Last 1 Encounters:  05/21/15 97%   Physical Exam  Constitutional: She is well-developed, well-nourished, and in no distress.  Intermittent cough communicating easily in full sentences.  HENT:  Head: Atraumatic.  Right Ear: Tympanic membrane and ear canal normal.  Left Ear: Tympanic membrane and ear canal normal.  Nose: Mucosal edema and rhinorrhea (scant clear mucus.) present. No epistaxis.  Mouth/Throat: Oropharynx is  clear and moist and mucous membranes are normal. No oropharyngeal exudate, posterior oropharyngeal edema or posterior oropharyngeal erythema.  Eyes: Conjunctivae are normal.  Neck: Neck supple.  Cardiovascular: Normal rate, S1 normal and S2 normal.   No murmur heard. Pulmonary/Chest: Effort normal. She has no wheezes. She has no rhonchi. She has no rales.  Abdominal: Soft. Normal appearance and bowel sounds are normal.  Musculoskeletal: She exhibits no edema.  Lymphadenopathy:    She has no cervical adenopathy.  Neurological: She is alert.  Skin: Skin is warm and intact. No rash noted. No cyanosis. Nails show no clubbing.   Diagnostics: Spirometry:  FVC 3.74--100%,  FEV1 2.96.--94%.    Skin testing: Strong reactivity to multiple grass, weed and tree pollens,  mild reactivity to selected mold species, cat hair, dog epithelial, and dust mite mix.    Monia Timmers M. Willa Rough, MD   cc: Aniceto Boss, MD

## 2015-05-21 NOTE — Patient Instructions (Signed)
Take Home Sheet  1. Avoidance: Mite, Mold and Pollen  2. Antihistamine: Zyrtec 10mg  by mouth once daily for runny nose or itching.  3. Nasal Spray: Flonase 2 spray(s) each nostril once daily for stuffy nose or drainage.   4. Continue Singulair 10mg  each evening.  5. Eye Drops: Zaditor one drop(s) each eye twice daily for itchy eyes.  6. Patanase 2 sprays each nostril each evening.  7. Nasal Saline wash prior to medicated nasal sprays each morning and evening.  8. Consider Sinus CT scan as discussed.  9.  Information on allergy injections.  10. Follow up Visit:  6 weeks or sooner if needed.   Websites that have reliable Patient information: 1. American Academy of Asthma, Allergy, & Immunology: www.aaaai.org 2. Food Allergy Network: www.foodallergy.org 3. Mothers of Asthmatics: www.aanma.org 4. [facility]: https://www.strong.com/www.njc.org 5. American College of Allergy, Asthma, & Immunology: BiggerRewards.iswww.allergy.mcg.edu or www.acaai.org

## 2015-05-23 ENCOUNTER — Encounter: Payer: Self-pay | Admitting: Allergy and Immunology

## 2015-05-23 NOTE — Progress Notes (Deleted)
NEW PATIENT NOTE  RE: Melissa [name] MRN: [id number] DOB: 09-21-1980 [facility] [address] [place] [address] Date of Office Visit: 05/21/2015  Dear Aniceto Boss, MD [address] [place], [place] [address]:  I had the pleasure of seeing Melissa [name] today in initial evaluation as you recall-- Subjective:  [name] is a 35 y.o. female who presents today for Cough; Nasal Congestion; and Sinusitis    Assessment:   1. Cough   2. Allergic rhinoconjunctivitis    Plan:   Meds ordered this encounter  Medications  . Olopatadine HCl 0.6 % SOLN    Sig: Use 2 sprays in each nostril once daily in the evening.    Dispense:  1 Bottle    Refill:  5  . cetirizine (ZYRTEC) 10 MG tablet    Sig: Take one tablet daily for runny nose or drainage.    Dispense:  30 tablet    Refill:  5   Patient Instructions  Take Home Sheet  1. Avoidance: Mite, Mold and Pollen  2. Antihistamine: Zyrtec  by mouth once daily for runny nose or itching.  3. Nasal Spray: Flonase 2 spray(s) each nostril once daily for stuffy nose or drainage.   4. Continue Singulair  each evening.  5. Eye Drops: Zaditor one drop(s) each eye twice daily for itchy eyes.  6. Patanase 2 sprays each nostril each evening.  7. Nasal Saline wash prior to medicated nasal sprays each morning and evening.  8. Consider Sinus CT scan as discussed.  9.  Information on allergy injections.  10. Follow up Visit:  6 weeks or sooner if needed.   Websites that have reliable Patient information: 1. American Academy of Asthma, Allergy, & Immunology: www.aaaai.org 2. Food Allergy Network: www.foodallergy.org 3. Mothers of Asthmatics: www.aanma.org 4. [facility]: https://www.strong.com/ 5. American College of Allergy, Asthma, & Immunology: BiggerRewards.is or www.acaai.org   HPI: *** Denies ED or Urgent care visits, prednisone or antibiotic  courses.  Medical History: Past Medical History  Diagnosis Date  . GERD (gastroesophageal reflux disease)   . Chronic headaches   . Elevated blood pressure    Surgical History: Past Surgical History  Procedure Laterality Date  . Tonsillectomy    . Cesarean section    . Cervical disc arthroplasty N/A 09/30/2013    Procedure: CERVICAL ANTERIOR DISC ARTHROPLASTY;  Surgeon: Maeola Harman, MD;  Location: [facility];  Service: Neurosurgery;  Laterality: N/A;  C6-7 Cervical artificial disc replacement  . Adenoidectomy     Family History: Family History  Problem Relation Age of Onset  . Asthma Brother   . Allergic rhinitis Brother   . Asthma Maternal Uncle   . Angioedema Neg Hx   . Eczema Neg Hx   . Immunodeficiency Neg Hx   . Urticaria Mother   . Allergic rhinitis Father   . Migraines Paternal Grandmother   . Allergic rhinitis Son   . Allergic rhinitis Daughter    Social History: Social History   Social History  . Marital Status: Divorced    Spouse Name: N/A  . Number of Children: N/A  . Years of Education: N/A   Occupational History  . Not on file.   Social History Main Topics  . Smoking status: Never Smoker   . Smokeless tobacco: Not on file  . Alcohol Use: Not on file  . Drug Use: Not on file  . Sexual Activity: Not on file   Other Topics  Concern  . Not on file   Social History Narrative    Melissa [name] has a current medication list which includes the following prescription(s): b complex vitamins, cefdinir, cetirizine, fexofenadine, fluticasone, ibuprofen, montelukast, acetaminophen, calcium, cholecalciferol, loratadine-pseudoephedrine, and olopatadine hcl.   Drug Allergies: Allergies  Allergen Reactions  . Caffeine Other (See Comments)    Headache   . Other Other (See Comments)    Citrus causes migraines    Environmental History: Melissa [name] lives in a *** year old house for *** with *** floors, with central heat and air; stuffed mattress, non-feather  pillow/comforter with*** humidifier, pets and smokers.   ROS   Immunological: No chronic or recurring infections. Objective:   Filed Vitals:   05/21/15 1240  BP: 140/88  Pulse: 86  Temp: 98.1 F (36.7 C)  Resp: 16   SpO2 Readings from Last 1 Encounters:  05/21/15 97%  ] Physical Exam  Diagnostics: Spirometry:  FVC  ***    FEV1    Skin testing:  reactivity to ***     Roselyn M. Willa RoughHicks, MD   cc: Aniceto BossSETTLE,PAUL C, MD

## 2015-06-15 ENCOUNTER — Ambulatory Visit: Payer: Self-pay | Admitting: Allergy and Immunology

## 2015-08-03 ENCOUNTER — Ambulatory Visit: Payer: PRIVATE HEALTH INSURANCE | Admitting: Allergy and Immunology

## 2016-05-12 ENCOUNTER — Telehealth: Payer: Self-pay | Admitting: Allergy & Immunology

## 2016-05-12 NOTE — Telephone Encounter (Signed)
Patient was seen by [facility] for allergy testing Patient [facility] [place] Patient is wanting to get [place] allergy shots in [place] However - the office states that the patient needs to have [facility] order the shots/vials and send to them because [facility] did the original allergy test. Can we order for the patient and have meds sent?? Please call patient with any questions.

## 2016-05-12 NOTE — Telephone Encounter (Signed)
Left message for patient to call the office. She will need an appointment with one of the new physicians since it has been a year since her first and only visit to discuss allergy injections.

## 2016-05-12 NOTE — Telephone Encounter (Signed)
Informed patient she needs to have an office visit.

## 2016-11-06 ENCOUNTER — Encounter (HOSPITAL_COMMUNITY): Payer: Self-pay | Admitting: *Deleted

## 2016-11-16 NOTE — Patient Instructions (Signed)
Melissa [name]  11/16/2016   Your procedure is scheduled on: 11-29-16   Report to [facility] Main  [name]  Elevators to 3rd floor to  Short Stay Center at 11:30 AM.   Call this number if you have problems the morning of surgery [phone]    Remember: ONLY 1 PERSON MAY GO WITH YOU TO SHORT STAY TO GET  READY MORNING OF YOUR SURGERY.  Do not eat food or drink liquids :After Midnight. You may have a Clear Liquid Diet from Midnight until 7:30 AM. After 7:30 AM, nothing until after surgery.     CLEAR LIQUID DIET   Foods Allowed                                                                     Foods Excluded  Coffee and tea, regular and decaf                             liquids that you cannot  Plain Jell-O in any flavor                                             see through such as: Fruit ices (not with fruit pulp)                                     milk, soups, orange juice  Iced Popsicles                                    All solid food Carbonated beverages, regular and diet                                    Cranberry, grape and apple juices Sports drinks like Gatorade Lightly seasoned clear broth or consume(fat free) Sugar, honey syrup  Sample Menu Breakfast                                Lunch                                     Supper Cranberry juice                    Beef broth                            Chicken broth Jell-O                                     Grape juice  Apple juice Coffee or tea                        Jell-O                                      Popsicle                                                Coffee or tea                        Coffee or tea  _____________________________________________________________________     Take these medicines the morning of surgery with A SIP OF WATER: Montelukast (Singular), Fexofenadine (Allegra). You may also bring and use your nasal spray as  needed.                                You may not have any metal on your body including hair pins and              piercings  Do not wear jewelry, make-up, lotions, powders or perfumes, deodorant             Do not wear nail polish.  Do not shave  48 hours prior to surgery.                Do not bring valuables to the hospital. [facility] IS NOT             RESPONSIBLE   FOR VALUABLES.  Contacts, dentures or bridgework may not be worn into surgery.  Leave suitcase in the car. After surgery it may be brought to your room.                 Please read over the following fact sheets you were given: _____________________________________________________________________             [facility] - Preparing for Surgery Before surgery, you can play an important role.  Because skin is not sterile, your skin needs to be as free of germs as possible.  You can reduce the number of germs on your skin by washing with CHG (chlorahexidine gluconate) soap before surgery.  CHG is an antiseptic cleaner which kills germs and bonds with the skin to continue killing germs even after washing. Please DO NOT use if you have an allergy to CHG or antibacterial soaps.  If your skin becomes reddened/irritated stop using the CHG and inform your nurse when you arrive at Short Stay. Do not shave (including legs and underarms) for at least 48 hours prior to the first CHG shower.  You may shave your face/neck. Please follow these instructions carefully:  1.  Shower with CHG Soap the night before surgery and the  morning of Surgery.  2.  If you choose to wash your hair, wash your hair first as usual with your  normal  shampoo.  3.  After you shampoo, rinse your hair and body thoroughly to remove the  shampoo.                           4.  Use CHG as you would any other liquid soap.  You can apply chg directly  to the skin and wash                       Gently with a scrungie or clean washcloth.  5.  Apply the CHG Soap to  your body ONLY FROM THE NECK DOWN.   Do not use on face/ open                           Wound or open sores. Avoid contact with eyes, ears mouth and genitals (private parts).                       Wash face,  Genitals (private parts) with your normal soap.             6.  Wash thoroughly, paying special attention to the area where your surgery  will be performed.  7.  Thoroughly rinse your body with warm water from the neck down.  8.  DO NOT shower/wash with your normal soap after using and rinsing off  the CHG Soap.                9.  Pat yourself dry with a clean towel.            10.  Wear clean pajamas.            11.  Place clean sheets on your bed the night of your first shower and do not  sleep with pets. Day of Surgery : Do not apply any lotions/deodorants the morning of surgery.  Please wear clean clothes to the hospital/surgery center.  FAILURE TO FOLLOW THESE INSTRUCTIONS MAY RESULT IN THE CANCELLATION OF YOUR SURGERY PATIENT SIGNATURE_________________________________  NURSE SIGNATURE__________________________________  ________________________________________________________________________   Rogelia MireIncentive Spirometer  An incentive spirometer is a tool that can help keep your lungs clear and active. This tool measures how well you are filling your lungs with each breath. Taking long deep breaths may help reverse or decrease the chance of developing breathing (pulmonary) problems (especially infection) following:  A long period of time when you are unable to move or be active. BEFORE THE PROCEDURE   If the spirometer includes an indicator to show your best effort, your nurse or respiratory therapist will set it to a desired goal.  If possible, sit up straight or lean slightly forward. Try not to slouch.  Hold the incentive spirometer in an upright position. INSTRUCTIONS FOR USE  1. Sit on the edge of your bed if possible, or sit up as far as you can in bed or on a  chair. 2. Hold the incentive spirometer in an upright position. 3. Breathe out normally. 4. Place the mouthpiece in your mouth and seal your lips tightly around it. 5. Breathe in slowly and as deeply as possible, raising the piston or the ball toward the top of the column. 6. Hold your breath for 3-5 seconds or for as long as possible. Allow the piston or ball to fall to the bottom of the column. 7. Remove the mouthpiece from your mouth and breathe out normally. 8. Rest for a few seconds and repeat Steps 1 through 7 at least 10 times every 1-2 hours when you are awake. Take your time and take a few normal breaths between deep breaths. 9. The spirometer may include an indicator to show your  best effort. Use the indicator as a goal to work toward during each repetition. 10. After each set of 10 deep breaths, practice coughing to be sure your lungs are clear. If you have an incision (the cut made at the time of surgery), support your incision when coughing by placing a pillow or rolled up towels firmly against it. Once you are able to get out of bed, walk around indoors and cough well. You may stop using the incentive spirometer when instructed by your caregiver.  RISKS AND COMPLICATIONS  Take your time so you do not get dizzy or light-headed.  If you are in pain, you may need to take or ask for pain medication before doing incentive spirometry. It is harder to take a deep breath if you are having pain. AFTER USE  Rest and breathe slowly and easily.  It can be helpful to keep track of a log of your progress. Your caregiver can provide you with a simple table to help with this. If you are using the spirometer at home, follow these instructions: SEEK MEDICAL CARE IF:   You are having difficultly using the spirometer.  You have trouble using the spirometer as often as instructed.  Your pain medication is not giving enough relief while using the spirometer.  You develop fever of 100.5 F  (38.1 C) or higher. SEEK IMMEDIATE MEDICAL CARE IF:   You cough up bloody sputum that had not been present before.  You develop fever of 102 F (38.9 C) or greater.  You develop worsening pain at or near the incision site. MAKE SURE YOU:   Understand these instructions.  Will watch your condition.  Will get help right away if you are not doing well or get worse. Document Released: 07/10/2006 Document Revised: 05/22/2011 Document Reviewed: 09/10/2006 [facility] Patient Information 2014 [place], [place].   ________________________________________________________________________

## 2016-11-17 ENCOUNTER — Encounter (HOSPITAL_COMMUNITY)
Admission: RE | Admit: 2016-11-17 | Discharge: 2016-11-17 | Disposition: A | Discharge status: Home / Self Care | Payer: PRIVATE HEALTH INSURANCE | Source: Ambulatory Visit | Attending: Orthopedic Surgery | Admitting: Orthopedic Surgery

## 2016-11-17 ENCOUNTER — Encounter (INDEPENDENT_AMBULATORY_CARE_PROVIDER_SITE_OTHER): Payer: Self-pay

## 2016-11-17 ENCOUNTER — Ambulatory Visit (HOSPITAL_COMMUNITY)
Admission: RE | Admit: 2016-11-17 | Discharge: 2016-11-17 | Disposition: A | Discharge status: Home / Self Care | Payer: PRIVATE HEALTH INSURANCE | Source: Ambulatory Visit | Attending: Surgical | Admitting: Surgical

## 2016-11-17 ENCOUNTER — Encounter (HOSPITAL_COMMUNITY): Payer: Self-pay

## 2016-11-17 DIAGNOSIS — M5136 Other intervertebral disc degeneration, lumbar region: Secondary | ICD-10-CM | POA: Insufficient documentation

## 2016-11-17 DIAGNOSIS — Z01812 Encounter for preprocedural laboratory examination: Secondary | ICD-10-CM | POA: Diagnosis present

## 2016-11-17 DIAGNOSIS — M545 Low back pain, unspecified: Secondary | ICD-10-CM

## 2016-11-17 DIAGNOSIS — Z01818 Encounter for other preprocedural examination: Secondary | ICD-10-CM

## 2016-11-17 DIAGNOSIS — Z01810 Encounter for preprocedural cardiovascular examination: Secondary | ICD-10-CM | POA: Diagnosis present

## 2016-11-17 DIAGNOSIS — I10 Essential (primary) hypertension: Secondary | ICD-10-CM | POA: Insufficient documentation

## 2016-11-17 DIAGNOSIS — M5126 Other intervertebral disc displacement, lumbar region: Secondary | ICD-10-CM | POA: Insufficient documentation

## 2016-11-17 LAB — COMPREHENSIVE METABOLIC PANEL
ALT: 23 U/L (ref 14–54)
AST: 28 U/L (ref 15–41)
Albumin: 4.2 g/dL (ref 3.5–5.0)
Alkaline Phosphatase: 52 U/L (ref 38–126)
Anion gap: 6 (ref 5–15)
BUN: 15 mg/dL (ref 6–20)
CO2: 27 mmol/L (ref 22–32)
Calcium: 9.1 mg/dL (ref 8.9–10.3)
Chloride: 106 mmol/L (ref 101–111)
Creatinine, Ser: 0.8 mg/dL (ref 0.44–1.00)
GFR calc Af Amer: >60 mL/min (ref >60)
GFR calc non Af Amer: >60 mL/min (ref >60)
Glucose, Bld: 149 mg/dL — ABNORMAL HIGH (ref 65–99)
Potassium: 4 mmol/L (ref 3.5–5.1)
Sodium: 139 mmol/L (ref 135–145)
Total Bilirubin: 0.5 mg/dL (ref 0.3–1.2)
Total Protein: 6.9 g/dL (ref 6.5–8.1)

## 2016-11-17 LAB — CBC WITH DIFFERENTIAL/PLATELET
Basophils Absolute: 0 10*3/uL (ref 0.0–0.1)
Basophils Relative: 1 %
Eosinophils Absolute: 0.4 10*3/uL (ref 0.0–0.7)
Eosinophils Relative: 5 %
HCT: 38.4 % (ref 36.0–46.0)
Hemoglobin: 13 g/dL (ref 12.0–15.0)
Lymphocytes Relative: 27 %
Lymphs Abs: 2 10*3/uL (ref 0.7–4.0)
MCH: 28.8 pg (ref 26.0–34.0)
MCHC: 33.9 g/dL (ref 30.0–36.0)
MCV: 85 fL (ref 78.0–100.0)
Monocytes Absolute: 0.5 10*3/uL (ref 0.1–1.0)
Monocytes Relative: 6 %
Neutro Abs: 4.6 10*3/uL (ref 1.7–7.7)
Neutrophils Relative %: 61 %
Platelets: 258 10*3/uL (ref 150–400)
RBC: 4.52 MIL/uL (ref 3.87–5.11)
RDW: 13.1 % (ref 11.5–15.5)
WBC: 7.5 10*3/uL (ref 4.0–10.5)

## 2016-11-17 LAB — URINALYSIS, ROUTINE W REFLEX MICROSCOPIC
Bilirubin Urine: NEGATIVE
Glucose, UA: NEGATIVE mg/dL
Hgb urine dipstick: NEGATIVE
Ketones, ur: NEGATIVE mg/dL
Leukocytes, UA: NEGATIVE
Nitrite: NEGATIVE
Protein, ur: NEGATIVE mg/dL
Specific Gravity, Urine: 1.017 (ref 1.005–1.030)
pH: 5 (ref 5.0–8.0)

## 2016-11-17 LAB — PREGNANCY, URINE: PREG TEST UR: NEGATIVE

## 2016-11-17 LAB — PROTIME-INR
INR: 0.95
Prothrombin Time: 12.6 seconds (ref 11.4–15.2)

## 2016-11-17 LAB — APTT: aPTT: 34 seconds (ref 24–36)

## 2016-11-17 LAB — SURGICAL PCR SCREEN
MRSA, PCR: NEGATIVE
Staphylococcus aureus: NEGATIVE

## 2016-11-29 ENCOUNTER — Ambulatory Visit (HOSPITAL_COMMUNITY): Payer: PRIVATE HEALTH INSURANCE

## 2016-11-29 ENCOUNTER — Encounter (HOSPITAL_COMMUNITY)
Admission: RE | Disposition: A | Discharge status: Home / Self Care | Payer: Self-pay | Source: Ambulatory Visit | Attending: Orthopedic Surgery

## 2016-11-29 ENCOUNTER — Observation Stay (HOSPITAL_COMMUNITY)
Admission: RE | Admit: 2016-11-29 | Discharge: 2016-11-30 | Disposition: A | Discharge status: Home / Self Care | Payer: PRIVATE HEALTH INSURANCE | Source: Ambulatory Visit | Attending: Orthopedic Surgery | Admitting: Orthopedic Surgery

## 2016-11-29 ENCOUNTER — Encounter (HOSPITAL_COMMUNITY): Payer: Self-pay

## 2016-11-29 ENCOUNTER — Ambulatory Visit (HOSPITAL_COMMUNITY): Payer: PRIVATE HEALTH INSURANCE | Admitting: Anesthesiology

## 2016-11-29 DIAGNOSIS — K219 Gastro-esophageal reflux disease without esophagitis: Secondary | ICD-10-CM | POA: Diagnosis not present

## 2016-11-29 DIAGNOSIS — Z825 Family history of asthma and other chronic lower respiratory diseases: Secondary | ICD-10-CM | POA: Insufficient documentation

## 2016-11-29 DIAGNOSIS — M5126 Other intervertebral disc displacement, lumbar region: Secondary | ICD-10-CM | POA: Diagnosis present

## 2016-11-29 DIAGNOSIS — I517 Cardiomegaly: Secondary | ICD-10-CM | POA: Diagnosis not present

## 2016-11-29 DIAGNOSIS — R51 Headache: Secondary | ICD-10-CM | POA: Insufficient documentation

## 2016-11-29 DIAGNOSIS — G8929 Other chronic pain: Secondary | ICD-10-CM | POA: Insufficient documentation

## 2016-11-29 DIAGNOSIS — Z8489 Family history of other specified conditions: Secondary | ICD-10-CM | POA: Diagnosis not present

## 2016-11-29 DIAGNOSIS — M48061 Spinal stenosis, lumbar region without neurogenic claudication: Secondary | ICD-10-CM | POA: Insufficient documentation

## 2016-11-29 DIAGNOSIS — M5136 Other intervertebral disc degeneration, lumbar region: Secondary | ICD-10-CM | POA: Diagnosis not present

## 2016-11-29 DIAGNOSIS — M4807 Spinal stenosis, lumbosacral region: Secondary | ICD-10-CM | POA: Insufficient documentation

## 2016-11-29 DIAGNOSIS — M5127 Other intervertebral disc displacement, lumbosacral region: Principal | ICD-10-CM | POA: Insufficient documentation

## 2016-11-29 DIAGNOSIS — Z91048 Other nonmedicinal substance allergy status: Secondary | ICD-10-CM | POA: Insufficient documentation

## 2016-11-29 DIAGNOSIS — Z419 Encounter for procedure for purposes other than remedying health state, unspecified: Secondary | ICD-10-CM

## 2016-11-29 DIAGNOSIS — Z79899 Other long term (current) drug therapy: Secondary | ICD-10-CM | POA: Diagnosis not present

## 2016-11-29 DIAGNOSIS — Z91018 Allergy to other foods: Secondary | ICD-10-CM | POA: Diagnosis not present

## 2016-11-29 HISTORY — PX: LUMBAR LAMINECTOMY/DECOMPRESSION MICRODISCECTOMY: SHX5026

## 2016-11-29 SURGERY — LUMBAR LAMINECTOMY/DECOMPRESSION MICRODISCECTOMY
Anesthesia: General | Laterality: Left

## 2016-11-29 MED ORDER — MIDAZOLAM HCL 5 MG/5ML IJ SOLN
INTRAMUSCULAR | Status: DC | PRN
  Administered 2016-11-29: 2 mg via INTRAVENOUS

## 2016-11-29 MED ORDER — MONTELUKAST SODIUM 10 MG PO TABS
10.0000 mg | ORAL_TABLET | Freq: Every day | ORAL | Status: DC
  Filled 2016-11-29: qty 1

## 2016-11-29 MED ORDER — BISACODYL 5 MG PO TBEC: 5.0000 mg | DELAYED_RELEASE_TABLET | Freq: Every day | ORAL | Status: DC | PRN

## 2016-11-29 MED ORDER — METHOCARBAMOL 500 MG PO TABS
500.0000 mg | ORAL_TABLET | Freq: Four times a day (QID) | ORAL | Status: DC | PRN
  Administered 2016-11-30: 500 mg via ORAL
  Filled 2016-11-29: qty 1

## 2016-11-29 MED ORDER — MIDAZOLAM HCL 2 MG/2ML IJ SOLN
INTRAMUSCULAR | Status: AC
  Filled 2016-11-29: qty 2

## 2016-11-29 MED ORDER — ROCURONIUM BROMIDE 50 MG/5ML IV SOSY
PREFILLED_SYRINGE | INTRAVENOUS | Status: AC
  Filled 2016-11-29: qty 5

## 2016-11-29 MED ORDER — SUGAMMADEX SODIUM 500 MG/5ML IV SOLN
INTRAVENOUS | Status: AC
  Filled 2016-11-29: qty 5

## 2016-11-29 MED ORDER — OXYCODONE HCL 5 MG/5ML PO SOLN
5.0000 mg | Freq: Once | ORAL | Status: DC | PRN
  Filled 2016-11-29: qty 5

## 2016-11-29 MED ORDER — PROPOFOL 10 MG/ML IV BOLUS
INTRAVENOUS | Status: AC
  Filled 2016-11-29: qty 20

## 2016-11-29 MED ORDER — METHOCARBAMOL 1000 MG/10ML IJ SOLN
500.0000 mg | Freq: Four times a day (QID) | INTRAVENOUS | Status: DC | PRN
  Filled 2016-11-29: qty 5

## 2016-11-29 MED ORDER — SUCCINYLCHOLINE CHLORIDE 20 MG/ML IJ SOLN
INTRAMUSCULAR | Status: DC | PRN
  Administered 2016-11-29: 120 mg via INTRAVENOUS

## 2016-11-29 MED ORDER — POLYMYXIN B SULFATE 500000 UNITS IJ SOLR
INTRAMUSCULAR | Status: DC | PRN
  Administered 2016-11-29: 500 mL

## 2016-11-29 MED ORDER — HYDROCODONE-ACETAMINOPHEN 5-325 MG PO TABS: 1.0000 | ORAL_TABLET | ORAL | Status: DC | PRN

## 2016-11-29 MED ORDER — ROCURONIUM BROMIDE 50 MG/5ML IV SOSY
PREFILLED_SYRINGE | INTRAVENOUS | Status: AC
  Filled 2016-11-29: qty 10

## 2016-11-29 MED ORDER — LIDOCAINE-EPINEPHRINE 1 %-1:100000 IJ SOLN
INTRAMUSCULAR | Status: DC | PRN
  Administered 2016-11-29: 20 mL

## 2016-11-29 MED ORDER — ACETAMINOPHEN 325 MG PO TABS: 650.0000 mg | ORAL_TABLET | ORAL | Status: DC | PRN

## 2016-11-29 MED ORDER — SUCCINYLCHOLINE CHLORIDE 200 MG/10ML IV SOSY
PREFILLED_SYRINGE | INTRAVENOUS | Status: AC
  Filled 2016-11-29: qty 10

## 2016-11-29 MED ORDER — CEFAZOLIN SODIUM-DEXTROSE 2-4 GM/100ML-% IV SOLN
2.0000 g | INTRAVENOUS | Status: AC
  Administered 2016-11-29: 2 g via INTRAVENOUS
  Filled 2016-11-29: qty 100

## 2016-11-29 MED ORDER — DEXAMETHASONE SODIUM PHOSPHATE 10 MG/ML IJ SOLN
INTRAMUSCULAR | Status: AC
  Filled 2016-11-29: qty 1

## 2016-11-29 MED ORDER — ONDANSETRON HCL 4 MG/2ML IJ SOLN
INTRAMUSCULAR | Status: AC
  Filled 2016-11-29: qty 2

## 2016-11-29 MED ORDER — OXYCODONE HCL 5 MG PO TABS: 5.0000 mg | ORAL_TABLET | Freq: Once | ORAL | Status: DC | PRN

## 2016-11-29 MED ORDER — BUPIVACAINE LIPOSOME 1.3 % IJ SUSP
20.0000 mL | Freq: Once | INTRAMUSCULAR | Status: DC
  Filled 2016-11-29: qty 20

## 2016-11-29 MED ORDER — BACITRACIN-NEOMYCIN-POLYMYXIN 400-5-5000 EX OINT
TOPICAL_OINTMENT | CUTANEOUS | Status: AC
  Filled 2016-11-29: qty 1

## 2016-11-29 MED ORDER — ONDANSETRON HCL 4 MG/2ML IJ SOLN
INTRAMUSCULAR | Status: DC | PRN
  Administered 2016-11-29: 4 mg via INTRAVENOUS

## 2016-11-29 MED ORDER — LIDOCAINE 2% (20 MG/ML) 5 ML SYRINGE
INTRAMUSCULAR | Status: AC
  Filled 2016-11-29: qty 5

## 2016-11-29 MED ORDER — BUPIVACAINE LIPOSOME 1.3 % IJ SUSP
INTRAMUSCULAR | Status: DC | PRN
  Administered 2016-11-29: 20 mL

## 2016-11-29 MED ORDER — CHLORHEXIDINE GLUCONATE 4 % EX LIQD: 60.0000 mL | Freq: Once | CUTANEOUS | Status: DC

## 2016-11-29 MED ORDER — HYDROMORPHONE HCL-NACL 0.5-0.9 MG/ML-% IV SOSY
PREFILLED_SYRINGE | INTRAVENOUS | Status: AC
  Filled 2016-11-29: qty 2

## 2016-11-29 MED ORDER — LACTATED RINGERS IV SOLN
INTRAVENOUS | Status: DC
  Administered 2016-11-29 (×3): via INTRAVENOUS

## 2016-11-29 MED ORDER — LACTATED RINGERS IV SOLN
INTRAVENOUS | Status: DC
  Administered 2016-11-29: 100 mL/h via INTRAVENOUS
  Administered 2016-11-30: 03:00:00 via INTRAVENOUS

## 2016-11-29 MED ORDER — FENTANYL CITRATE (PF) 250 MCG/5ML IJ SOLN
INTRAMUSCULAR | Status: AC
Start: 2016-11-29 — End: ?
  Filled 2016-11-29: qty 5

## 2016-11-29 MED ORDER — PROPOFOL 10 MG/ML IV BOLUS
INTRAVENOUS | Status: DC | PRN
  Administered 2016-11-29: 160 mg via INTRAVENOUS

## 2016-11-29 MED ORDER — POLYMYXIN B SULFATE 500000 UNITS IJ SOLR
INTRAMUSCULAR | Status: AC
  Filled 2016-11-29: qty 500000

## 2016-11-29 MED ORDER — FENTANYL CITRATE (PF) 250 MCG/5ML IJ SOLN
INTRAMUSCULAR | Status: AC
  Filled 2016-11-29: qty 5

## 2016-11-29 MED ORDER — ROCURONIUM BROMIDE 100 MG/10ML IV SOLN
INTRAVENOUS | Status: DC | PRN
  Administered 2016-11-29: 10 mg via INTRAVENOUS
  Administered 2016-11-29: 50 mg via INTRAVENOUS

## 2016-11-29 MED ORDER — LIDOCAINE-EPINEPHRINE (PF) 1 %-1:200000 IJ SOLN
INTRAMUSCULAR | Status: AC
  Filled 2016-11-29: qty 30

## 2016-11-29 MED ORDER — POLYETHYLENE GLYCOL 3350 17 G PO PACK: 17.0000 g | PACK | Freq: Every day | ORAL | Status: DC | PRN

## 2016-11-29 MED ORDER — ONDANSETRON HCL 4 MG/2ML IJ SOLN: 4.0000 mg | Freq: Four times a day (QID) | INTRAMUSCULAR | Status: DC | PRN

## 2016-11-29 MED ORDER — MENTHOL 3 MG MT LOZG: 1.0000 | LOZENGE | OROMUCOSAL | Status: DC | PRN

## 2016-11-29 MED ORDER — FLUTICASONE PROPIONATE 50 MCG/ACT NA SUSP
2.0000 | Freq: Every day | NASAL | Status: DC | PRN
  Filled 2016-11-29: qty 16

## 2016-11-29 MED ORDER — DEXAMETHASONE SODIUM PHOSPHATE 10 MG/ML IJ SOLN
INTRAMUSCULAR | Status: DC | PRN
  Administered 2016-11-29: 10 mg via INTRAVENOUS

## 2016-11-29 MED ORDER — SUGAMMADEX SODIUM 500 MG/5ML IV SOLN
INTRAVENOUS | Status: DC | PRN
  Administered 2016-11-29: 400 mg via INTRAVENOUS

## 2016-11-29 MED ORDER — OXYCODONE-ACETAMINOPHEN 5-325 MG PO TABS
2.0000 | ORAL_TABLET | ORAL | Status: DC | PRN
  Administered 2016-11-29 – 2016-11-30 (×5): 1 via ORAL
  Filled 2016-11-29 (×4): qty 2

## 2016-11-29 MED ORDER — ONDANSETRON HCL 4 MG PO TABS: 4.0000 mg | ORAL_TABLET | Freq: Four times a day (QID) | ORAL | Status: DC | PRN

## 2016-11-29 MED ORDER — FENTANYL CITRATE (PF) 100 MCG/2ML IJ SOLN
INTRAMUSCULAR | Status: AC
  Filled 2016-11-29: qty 2

## 2016-11-29 MED ORDER — PHENOL 1.4 % MT LIQD
1.0000 | OROMUCOSAL | Status: DC | PRN
  Filled 2016-11-29: qty 177

## 2016-11-29 MED ORDER — FENTANYL CITRATE (PF) 250 MCG/5ML IJ SOLN
INTRAMUSCULAR | Status: DC | PRN
  Administered 2016-11-29: 50 ug via INTRAVENOUS
  Administered 2016-11-29: 100 ug via INTRAVENOUS
  Administered 2016-11-29 (×4): 50 ug via INTRAVENOUS

## 2016-11-29 MED ORDER — ACETAMINOPHEN 650 MG RE SUPP: 650.0000 mg | RECTAL | Status: DC | PRN

## 2016-11-29 MED ORDER — HYDROMORPHONE HCL-NACL 0.5-0.9 MG/ML-% IV SOSY: 0.5000 mg | PREFILLED_SYRINGE | INTRAVENOUS | Status: DC | PRN

## 2016-11-29 MED ORDER — LIDOCAINE HCL (CARDIAC) 20 MG/ML IV SOLN
INTRAVENOUS | Status: DC | PRN
  Administered 2016-11-29: 80 mg via INTRATRACHEAL

## 2016-11-29 MED ORDER — HYDROMORPHONE HCL-NACL 0.5-0.9 MG/ML-% IV SOSY
0.2500 mg | PREFILLED_SYRINGE | INTRAVENOUS | Status: DC | PRN
  Administered 2016-11-29 (×3): 0.5 mg via INTRAVENOUS

## 2016-11-29 MED ORDER — FLEET ENEMA 7-19 GM/118ML RE ENEM: 1.0000 | ENEMA | Freq: Once | RECTAL | Status: DC | PRN

## 2016-11-29 MED ORDER — CEFAZOLIN SODIUM-DEXTROSE 1-4 GM/50ML-% IV SOLN
1.0000 g | Freq: Three times a day (TID) | INTRAVENOUS | Status: DC
  Administered 2016-11-29 – 2016-11-30 (×2): 1 g via INTRAVENOUS
  Filled 2016-11-29 (×2): qty 50

## 2016-11-29 SURGICAL SUPPLY — 48 items
BAG ZIPLOCK 12X15 (MISCELLANEOUS) IMPLANT
BENZOIN TINCTURE PRP APPL 2/3 (GAUZE/BANDAGES/DRESSINGS) ×3 IMPLANT
CLEANER TIP ELECTROSURG 2X2 (MISCELLANEOUS) ×3 IMPLANT
COVER SURGICAL LIGHT HANDLE (MISCELLANEOUS) ×3 IMPLANT
DRAIN PENROSE 18X1/4 LTX STRL (WOUND CARE) IMPLANT
DRAPE MICROSCOPE LEICA (MISCELLANEOUS) ×3 IMPLANT
DRAPE POUCH INSTRU U-SHP 10X18 (DRAPES) ×3 IMPLANT
DRAPE SHEET LG 3/4 BI-LAMINATE (DRAPES) ×3 IMPLANT
DRAPE SURG 17X11 SM STRL (DRAPES) ×3 IMPLANT
DRSG ADAPTIC 3X8 NADH LF (GAUZE/BANDAGES/DRESSINGS) ×3 IMPLANT
DRSG EMULSION OIL 3X16 NADH (GAUZE/BANDAGES/DRESSINGS) ×3 IMPLANT
DRSG PAD ABDOMINAL 8X10 ST (GAUZE/BANDAGES/DRESSINGS) ×12 IMPLANT
DURAPREP 26ML APPLICATOR (WOUND CARE) ×3 IMPLANT
ELECT BLADE TIP CTD 4 INCH (ELECTRODE) ×3 IMPLANT
ELECT REM PT RETURN 15FT ADLT (MISCELLANEOUS) ×3 IMPLANT
GAUZE SPONGE 4X4 12PLY STRL (GAUZE/BANDAGES/DRESSINGS) ×3 IMPLANT
GLOVE BIOGEL PI IND STRL 6.5 (GLOVE) ×1 IMPLANT
GLOVE BIOGEL PI IND STRL 8.5 (GLOVE) ×1 IMPLANT
GLOVE BIOGEL PI INDICATOR 6.5 (GLOVE) ×2
GLOVE BIOGEL PI INDICATOR 8.5 (GLOVE) ×2
GLOVE ECLIPSE 8.0 STRL XLNG CF (GLOVE) ×6 IMPLANT
GLOVE SURG SS PI 6.5 STRL IVOR (GLOVE) ×3 IMPLANT
GOWN STRL REUS W/ TWL LRG LVL3 (GOWN DISPOSABLE) ×1 IMPLANT
GOWN STRL REUS W/TWL LRG LVL3 (GOWN DISPOSABLE) ×2
GOWN STRL REUS W/TWL XL LVL3 (GOWN DISPOSABLE) ×6 IMPLANT
HEMOSTAT SPONGE AVITENE ULTRA (HEMOSTASIS) ×3 IMPLANT
KIT BASIN OR (CUSTOM PROCEDURE TRAY) ×3 IMPLANT
KIT POSITIONING SURG ANDREWS (MISCELLANEOUS) ×3 IMPLANT
MANIFOLD NEPTUNE II (INSTRUMENTS) ×3 IMPLANT
MARKER SKIN DUAL TIP RULER LAB (MISCELLANEOUS) ×3 IMPLANT
NEEDLE HYPO 22GX1.5 SAFETY (NEEDLE) ×6 IMPLANT
NEEDLE SPNL 18GX3.5 QUINCKE PK (NEEDLE) ×6 IMPLANT
PACK LAMINECTOMY ORTHO (CUSTOM PROCEDURE TRAY) ×3 IMPLANT
PAD ABD 7.5X8 STRL (GAUZE/BANDAGES/DRESSINGS) ×3 IMPLANT
PATTIES SURGICAL .5 X.5 (GAUZE/BANDAGES/DRESSINGS) ×3 IMPLANT
PATTIES SURGICAL .75X.75 (GAUZE/BANDAGES/DRESSINGS) ×3 IMPLANT
PATTIES SURGICAL 1X1 (DISPOSABLE) ×3 IMPLANT
PIN SAFETY NICK PLATE  2 MED (MISCELLANEOUS)
PIN SAFETY NICK PLATE 2 MED (MISCELLANEOUS) IMPLANT
SPONGE LAP 4X18 X RAY DECT (DISPOSABLE) ×6 IMPLANT
STAPLER VISISTAT 35W (STAPLE) ×3 IMPLANT
SUT VIC AB 1 CT1 27 (SUTURE) ×4
SUT VIC AB 1 CT1 27XBRD ANTBC (SUTURE) ×2 IMPLANT
SUT VIC AB 2-0 CT1 27 (SUTURE) ×4
SUT VIC AB 2-0 CT1 TAPERPNT 27 (SUTURE) ×2 IMPLANT
SYR 20CC LL (SYRINGE) ×6 IMPLANT
TAPE CLOTH SURG 6X10 WHT LF (GAUZE/BANDAGES/DRESSINGS) ×3 IMPLANT
TOWEL OR 17X26 10 PK STRL BLUE (TOWEL DISPOSABLE) ×3 IMPLANT

## 2016-11-29 NOTE — H&P (Signed)
Melissa [name] is an 36 y.o. female.   Chief Complaint: Back and left leg pain HPI: She has had progressive low back pain radiating down her Left leg.  Past Medical History:  Diagnosis Date  . Chronic headaches   . Elevated blood pressure   . GERD (gastroesophageal reflux disease)     Past Surgical History:  Procedure Laterality Date  . ADENOIDECTOMY    . CERVICAL DISC ARTHROPLASTY N/A 09/30/2013   Procedure: CERVICAL ANTERIOR DISC ARTHROPLASTY;  Surgeon: Maeola Harman, MD;  Location: [facility];  Service: Neurosurgery;  Laterality: N/A;  C6-7 Cervical artificial disc replacement  . CESAREAN SECTION    . TONSILLECTOMY      Family History  Problem Relation Age of Onset  . Asthma Brother   . Allergic rhinitis Brother   . Asthma Maternal Uncle   . Urticaria Mother   . Allergic rhinitis Father   . Migraines Paternal Grandmother   . Allergic rhinitis Son   . Allergic rhinitis Daughter   . Angioedema Neg Hx   . Eczema Neg Hx   . Immunodeficiency Neg Hx    Social History:  reports that she has never smoked. She has never used smokeless tobacco. She reports that she drinks alcohol. She reports that she does not use drugs.  Allergies:  Allergies  Allergen Reactions  . Caffeine Other (See Comments)    Headache   . Other Other (See Comments)    Citrus causes migraines    Medications Prior to Admission  Medication Sig Dispense Refill  . acetaminophen (TYLENOL) 650 MG CR tablet Take 1,300 mg by mouth every 8 (eight) hours as needed for pain. Reported on 05/21/2015    . b complex vitamins tablet Take 2 tablets by mouth daily.     Marland Kitchen BEE POLLEN PO Take 15 mLs by mouth daily.     . DUEXIS 800-26.6 MG TABS Take 1 tablet by mouth 3 (three) times daily.   3  . fexofenadine (ALLEGRA ALLERGY) 180 MG tablet Take 180 mg by mouth daily. Every am.    . Menthol, Topical Analgesic, (BIOFREEZE EX) Apply 1 application topically as needed (pain).    . montelukast (SINGULAIR) 10 MG tablet Take  10 mg by mouth daily.    . Olopatadine HCl 0.6 % SOLN Use 2 sprays in each nostril once daily in the evening. 1 Bottle 5  . trolamine salicylate (ASPERCREME) 10 % cream Apply 1 application topically as needed for muscle pain.    . cetirizine (ZYRTEC) 10 MG tablet Take one tablet daily for runny nose or drainage. (Patient not taking: Reported on 11/15/2016) 30 tablet 5  . fluticasone (FLONASE) 50 MCG/ACT nasal spray Place 2 sprays into both nostrils daily as needed for allergies.       No results found for this or any previous visit (from the past 48 hour(s)). No results found.  Review of Systems  Constitutional: Negative.   HENT: Negative.   Eyes: Negative.   Respiratory: Negative.   Cardiovascular: Negative.   Gastrointestinal: Negative.   Genitourinary: Negative.   Musculoskeletal: Positive for back pain.  Skin: Negative.   Neurological: Negative.   Endo/Heme/Allergies: Negative.   Psychiatric/Behavioral: Negative.     Blood pressure 132/86, pulse 70, temperature 98 F (36.7 C), temperature source Oral, resp. rate 16, height  (1.702 m), weight 101.6 kg (224 lb), last menstrual period 11/16/2016, SpO2 100 %. Physical Exam  Constitutional: She appears well-developed.  HENT:  Head: Normocephalic.  Eyes: Pupils are  equal, round, and reactive to light.  Neck: Normal range of motion.  Cardiovascular: Normal rate.   Respiratory: Effort normal.  GI: Soft.  Musculoskeletal: She exhibits tenderness.  Neurological: She is alert.  Skin: Skin is warm.  Psychiatric: She has a normal mood and affect.     Assessment/Plan Decompressive lumbar laminectomy and Microdiscectomy on the left.  Jacki Cones, MD 11/29/2016, 1:50 PM

## 2016-11-29 NOTE — Transfer of Care (Signed)
Immediate Anesthesia Transfer of Care Note  Patient: Saleemah Mollenhauer  Procedure(s) Performed: Procedure(s): Hemilaminectomy and microdisectomy; decompression left lateral recess L5-S1, left (Left)  Patient Location: PACU  Anesthesia Type:General  Level of Consciousness: awake, alert  and oriented  Airway & Oxygen Therapy: Patient Spontanous Breathing and Patient connected to face mask oxygen  Post-op Assessment: Report given to RN and Post -op Vital signs reviewed and stable  Post vital signs: Reviewed and stable  Last Vitals:  Vitals:   11/29/16 1135  BP: 132/86  Pulse: 70  Resp: 16  Temp: 36.7 C  SpO2: 100%    Last Pain:  Vitals:   11/29/16 1153  TempSrc:   PainSc: 4       Patients Stated Pain Goal: 4 (11/29/16 1153)  Complications: No apparent anesthesia complications

## 2016-11-29 NOTE — Anesthesia Procedure Notes (Signed)
Procedure Name: Intubation Date/Time: 11/29/2016 2:06 PM Performed by: [place] Blaike Vickers C Pre-anesthesia Checklist: Patient identified, Emergency Drugs available, Suction available and Patient being monitored Patient Re-evaluated:Patient Re-evaluated prior to induction Oxygen Delivery Method: Circle system utilized Preoxygenation: Pre-oxygenation with 100% oxygen Induction Type: IV induction Ventilation: Mask ventilation without difficulty Laryngoscope Size: Mac and 3 Grade View: Grade I Tube type: Oral Tube size: 7.0 mm Number of attempts: 1 Airway Equipment and Method: Stylet and Oral airway Placement Confirmation: ETT inserted through vocal cords under direct vision,  positive ETCO2 and breath sounds checked- equal and bilateral Secured at: 21 cm Tube secured with: Tape Dental Injury: Teeth and Oropharynx as per pre-operative assessment  Comments: Minimal neck extension on intubation

## 2016-11-29 NOTE — Anesthesia Preprocedure Evaluation (Addendum)
Anesthesia Evaluation  Patient identified by MRN, date of birth, ID band Patient awake    Reviewed: Allergy & Precautions, NPO status , Patient's Chart, lab work & pertinent test results  Airway Mallampati: I  TM Distance: >3 FB Neck ROM: Full    Dental no notable dental hx. (+) Teeth Intact   Pulmonary neg pulmonary ROS,    Pulmonary exam normal breath sounds clear to auscultation       Cardiovascular negative cardio ROS Normal cardiovascular exam Rhythm:Regular Rate:Normal     Neuro/Psych negative neurological ROS     GI/Hepatic Neg liver ROS, GERD  ,  Endo/Other  negative endocrine ROS  Renal/GU negative Renal ROS     Musculoskeletal   Abdominal   Peds  Hematology negative hematology ROS (+)   Anesthesia Other Findings   Reproductive/Obstetrics                            Anesthesia Physical Anesthesia Plan  ASA: II  Anesthesia Plan: General   Post-op Pain Management:    Induction: Intravenous  PONV Risk Score and Plan: 4 or greater and Ondansetron, Dexamethasone, Midazolam, Scopolamine patch - Pre-op and Treatment may vary due to age or medical condition  Airway Management Planned: Oral ETT  Additional Equipment:   Intra-op Plan:   Post-operative Plan: Extubation in OR  Informed Consent: I have reviewed the patients History and Physical, chart, labs and discussed the procedure including the risks, benefits and alternatives for the proposed anesthesia with the patient or authorized representative who has indicated his/her understanding and acceptance.   Dental advisory given  Plan Discussed with: CRNA  Anesthesia Plan Comments:         Anesthesia Quick Evaluation

## 2016-11-29 NOTE — Brief Op Note (Signed)
11/29/2016  3:47 PM  PATIENT:  Melissa [name]  36 y.o. 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  POST-OPERATIVE DIAGNOSIS:  Same as Pre-Op  PROCEDURE:  Decompressive Lumbar Laminectomy for Stenosis;Microdiscectomy at L-5-S-1 on the Left for HNP;Foraminotomies for L-5 and S-1 on the Left for Foraminal Stenosis. SURGEON:  Surgeon(s) and Role:    * Ranee Gosselin, MD - Primary  PHYSICIAN ASSISTANT: Dimitri Ped PA  ASSISTANTS: Dimitri Ped PA  ANESTHESIA:   general  EBL:  Total I/O In: 1000 [I.V.:1000] Out: 50 [Blood:50]  BLOOD ADMINISTERED:none  DRAINS: none   LOCAL MEDICATIONS USED:  MARCAINE20cc of 1% with Epinephrine at the start of the case and 20cc of Exparel at the end of the case.    SPECIMEN:  No Specimen  DISPOSITION OF SPECIMEN:  N/A  COUNTS:  YES  TOURNIQUET:  * No tourniquets in log *  DICTATION: .Other Dictation: Dictation Number [id number]  PLAN OF CARE: Admit for overnight observation  PATIENT DISPOSITION:  Stable in OR   Delay start of Pharmacological VTE agent (>24hrs) due to surgical blood loss or risk of bleeding: yes

## 2016-11-29 NOTE — Anesthesia Postprocedure Evaluation (Signed)
Anesthesia Post Note  Patient: Melissa [name]  Procedure(s) Performed: Procedure(s) (LRB): Hemilaminectomy and microdisectomy; decompression left lateral recess L5-S1, left (Left)     Patient location during evaluation: PACU Anesthesia Type: General Level of consciousness: awake and alert Pain management: pain level controlled Vital Signs Assessment: post-procedure vital signs reviewed and stable Respiratory status: spontaneous breathing, nonlabored ventilation, respiratory function stable and patient connected to nasal cannula oxygen Cardiovascular status: blood pressure returned to baseline and stable Postop Assessment: no apparent nausea or vomiting Anesthetic complications: no    Last Vitals:  Vitals:   11/29/16 1645 11/29/16 1709  BP: 126/78 119/60  Pulse: (!) 57 64  Resp: 11 16  Temp: 36.7 C 36.7 C  SpO2: 99% 98%    Last Pain:  Vitals:   11/29/16 1656  TempSrc:   PainSc: 4                  Renato Spellman A.

## 2016-11-30 DIAGNOSIS — M5127 Other intervertebral disc displacement, lumbosacral region: Secondary | ICD-10-CM | POA: Diagnosis not present

## 2016-11-30 MED ORDER — METHOCARBAMOL 500 MG PO TABS: 500.0000 mg | ORAL_TABLET | Freq: Four times a day (QID) | ORAL | 0 refills | Status: AC | PRN

## 2016-11-30 MED ORDER — OXYCODONE-ACETAMINOPHEN 5-325 MG PO TABS: 1.0000 | ORAL_TABLET | ORAL | 0 refills | Status: AC | PRN

## 2016-11-30 NOTE — Op Note (Signed)
NAME:  Melissa [name], Melissa [name]                   ACCOUNT NO.:  MEDICAL RECORD NO.:  [id number]  LOCATION:                                 FACILITY:  PHYSICIAN:  Tavares Levinson A. Fayetta Sorenson, M.D.DATE OF BIRTH:  [birth year]  DATE OF PROCEDURE:  11/29/2016 DATE OF DISCHARGE:                              OPERATIVE REPORT   SURGEON:  Georges Lynch. Darrelyn Hillock, M.D.  ASSISTANT:  Dimitri Ped, [place]  PREOPERATIVE DIAGNOSES: 1. 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 2. Herniated lumbar disk, L5-S1 on the left. 3. Foraminal stenosis involving the L5 root on the left. 4. Foraminal stenosis involving the S1 root on the left.  POSTOPERATIVE DIAGNOSES: 1. 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 2. Herniated lumbar disk, L5-S1 on the left. 3. Foraminal stenosis involving the L5 root on the left. 4. Foraminal stenosis involving the S1 root on the left.  OPERATIONS: 1. Central decompressive lumbar laminectomy with lateral recess     decompression, L5-S1 on the left. 2. Microdiskectomy, L5-S1 on the left. 3. Foraminotomy for the L5 root on the left. 4. Foraminotomy for the S1 root on the left.  DESCRIPTION OF PROCEDURE:  Under general anesthesia, routine orthopedic prep and draping of the lower back was carried out.  The patient on a spinal frame.  The appropriate time-out was first carried out, I also marked the appropriate left side of the back, even though we went central, all her symptoms were on the left.  At this time, two needles were placed in the back.  The patient had 2 g of Ancef.  X-ray was taken for positional purposes.  After that, an incision was made over the L5- S1 interspace.  It was slightly extended distally and proximally.  I then separated the muscle from the lamina and spinous processes bilaterally.  Another x-ray was taken with a Kocher clamp on the spinous process.  Appropriate level was identified.  I then inserted the [facility] retractors.  I went down and dissected the muscle  off the lamina as I mentioned.  I exposed the ligamentum flavum at L5-S1, another x-ray was taken.  At this time, I then went down central because of the tightness of the area to work with.  We then went out central and to the left, did a nice decompression.  The microscope was brought in. I gently removed the ligamentum flavum and protected the dura at all time with the cottonoids.  Once this was done, another x-ray was taken. At this point, I first identified the S1 root.  I did a foraminotomy for the root as it went out.  Following that, I gently inserted the [employer] retractors and dissected the fatty tissue off the disk space.  I identified the disk space.  I cauterized it with the bipolar.  Cruciate incision then was made in the posterior longitudinal ligament.  The nerve hook was inserted.  I dissected out some disk material.  I then went down in with micro-pituitaries followed by the larger pituitaries and completed the diskectomy.  I did utilize the Epstein curette to go out laterally and medially to make sure we decompressed the disk into the space before  removed it.  Once this was done, we were able to easily pass a hockey-stick out the foramina for the L5 root and the S1 root on the left.  We had good hemostasis.  I irrigated the area out, loosely applied some Ultrafoam Gelfoam.  I then closed the wound layers in usual fashion except I left the small distal deep and proximal part of the wound open for drainage purposes.  The remaining part of the wound was closed in usual fashion, skin with metal staples.  At the beginning of the case, I injected 20 mL of 1% lidocaine with epinephrine for bleeding control.  At the end of the case, I injected 20 mL of Exparel into the soft tissue.  The plan, I will keep the patient overnight.          ______________________________ Georges Lynch. Darrelyn Hillock, M.D.     RAG/MEDQ  D:  11/29/2016  T:  11/30/2016  Job:  [id number]

## 2016-11-30 NOTE — Progress Notes (Signed)
Subjective: 1 Day Post-Op Procedure(s) (LRB): Hemilaminectomy and microdisectomy; decompression left lateral recess L5-S1, left (Left) Patient reports pain as 2 on 0-10 scale. Doing well.No leg pain.   Objective: Vital signs in last 24 hours: Temp:  [97.6 F (36.4 C)-98.4 F (36.9 C)] 97.6 F (36.4 C) (09/20 0500) Pulse Rate:  [57-85] 66 (09/20 0500) Resp:  [11-20] 16 (09/20 0500) BP: (110-136)/(60-87) 133/72 (09/20 0500) SpO2:  [96 %-100 %] 96 % (09/20 0500) Weight:  [101.6 kg (224 lb)] 101.6 kg (224 lb) (09/19 1709)  Intake/Output from previous day: 09/19 0701 - 09/20 0700 In: 2816.7 [P.O.:390; I.V.:2326.7; IV Piggyback:100] Out: 50 [Blood:50] Intake/Output this shift: Total I/O In: 1616.7 [P.O.:390; I.V.:1126.7; IV Piggyback:100] Out: -   No results for input(s): HGB in the last 72 hours. No results for input(s): WBC, RBC, HCT, PLT in the last 72 hours. No results for input(s): NA, K, CL, CO2, BUN, CREATININE, GLUCOSE, CALCIUM in the last 72 hours. No results for input(s): LABPT, INR in the last 72 hours.  Neurologically intact Dorsiflexion/Plantar flexion intact  Assessment/Plan: 1 Day Post-Op Procedure(s) (LRB): Hemilaminectomy and microdisectomy; decompression left lateral recess L5-S1, left (Left) Up with therapy discontinued after PT  Melissa [name] 11/30/2016, 6:54 AM

## 2016-11-30 NOTE — Evaluation (Signed)
Physical Therapy One Time Evaluation Patient Details Name: Melissa [name] MRN: [id number] DOB: 1980/07/02 Today's Date: 11/30/2016   History of Present Illness  S/P L5-S1 decompression  Clinical Impression  Patient evaluated by Physical Therapy with no further acute PT needs identified. All education has been completed and the patient has no further questions. Pt tolerated ambulation and performing steps well.  Reviewed back precautions and answered questions.  Pt hoping to perform trail walking this weekend, so recommended she not perform this activity due to varying ground environment and instead walk on flat solid surfaces. See below for any follow-up Physical Therapy or equipment needs. PT is signing off. Thank you for this referral.   Follow Up Recommendations No PT follow up    Equipment Recommendations  None recommended by PT    Recommendations for Other Services       Precautions / Restrictions Precautions Precautions: Back Precaution Comments: reviewed back precautions Restrictions Weight Bearing Restrictions: No      Mobility  Bed Mobility               General bed mobility comments: min guard for safety with log rolling  Transfers Overall transfer level: Needs assistance Equipment used: None Transfers: Sit to/from Stand Sit to Stand: Supervision         General transfer comment: performs well and maintains back precautions  Ambulation/Gait Ambulation/Gait assistance: Supervision Ambulation Distance (Feet): 250 Feet Assistive device: None Gait Pattern/deviations: Step-through pattern     General Gait Details: slow but steady pace, reviewed precaution during mobility  Stairs Stairs: Yes Stairs assistance: Supervision Stair Management: Alternating pattern;Forwards;One rail Left Number of Stairs: 4 General stair comments: educated on step to pattern however pt able to perform step through pattern without difficulty  Wheelchair Mobility     Modified Rankin (Stroke Patients Only)       Balance                                             Pertinent Vitals/Pain Pain Assessment: 0-10 Pain Score: 3  Pain Location: back Pain Descriptors / Indicators: Sore Pain Intervention(s): Monitored during session    Home Living Family/patient expects to be discharged to:: Private residence Living Arrangements: Children;Parent Available Help at Discharge: Family Type of Home: House Home Access: Stairs to enter Entrance Stairs-Rails: Left Entrance Stairs-Number of Steps: 5 Home Layout: Able to live on main level with bedroom/bathroom;Two level Home Equipment: None Additional Comments: has DME at home for Dad; shower seat, possibly 3:1    Prior Function Level of Independence: Independent               Hand Dominance        Extremity/Trunk Assessment   Upper Extremity Assessment Upper Extremity Assessment: Overall WFL for tasks assessed    Lower Extremity Assessment Lower Extremity Assessment: Overall WFL for tasks assessed LLE Deficits / Details: reports presurgical pain from L lower gluteal area down posterior thigh, denies current symptoms       Communication   Communication: No difficulties  Cognition Arousal/Alertness: Awake/alert Behavior During Therapy: WFL for tasks assessed/performed Overall Cognitive Status: Within Functional Limits for tasks assessed                                        General  Comments      Exercises     Assessment/Plan    PT Assessment Patent does not need any further PT services  PT Problem List         PT Treatment Interventions      PT Goals (Current goals can be found in the Care Plan section)  Acute Rehab PT Goals 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 : A Little Difficulty sitting down on and standing up from a chair with arms (e.g., wheelchair, bedside commode, etc,.)?: A Little Help needed moving to and from a bed to chair (including a wheelchair)?: A Little Help needed walking in hospital room?: A Little Help needed climbing 3-5 steps with a railing? : A Little 6 Click Score: 18    End of Session   Activity Tolerance: Patient tolerated treatment well Patient left: in chair;with call bell/phone within reach;with family/visitor present Nurse Communication: Mobility status PT Visit Diagnosis: Difficulty in walking, not elsewhere classified (R26.2)    Time: 1610-9604 PT Time Calculation (min) (ACUTE ONLY): 8 min   Charges:   PT Evaluation $PT Eval Low Complexity: 1 Low     PT G Codes:   PT G-Codes **NOT FOR INPATIENT CLASS** Functional Assessment Tool Used: AM-PAC 6 Clicks Basic Mobility;Clinical judgement Functional Limitation: Mobility: Walking and moving around Mobility: Walking and Moving Around Current Status (V4098): At least 20 percent but less than 40 percent impaired, limited or restricted Mobility: Walking and Moving Around Goal Status [phone]): At least 1 percent but less than 20 percent impaired, limited or restricted Mobility: Walking and Moving Around Discharge Status [phone]): At least 1 percent but less than 20 percent impaired, limited or restricted    Zenovia Jarred, PT, DPT 11/30/2016 Pager: [phone]  Maida Sale E 11/30/2016, 11:49 AM

## 2016-11-30 NOTE — Evaluation (Signed)
Occupational Therapy Evaluation Patient Details Name: Gregg Holster MRN: [id number] DOB: 01-17-1981 Today's Date: 11/30/2016    History of Present Illness S/P L5-S1 decompression   Clinical Impression   This 36 year old female was admitted for the above sx. All education was completed.  No further OT is needed at this time    Follow Up Recommendations  No OT follow up    Equipment Recommendations  None recommended by OT    Recommendations for Other Services       Precautions / Restrictions Precautions Precautions: Back Restrictions Weight Bearing Restrictions: No      Mobility Bed Mobility               General bed mobility comments: min guard for safety with log rolling  Transfers                 General transfer comment: supervision; cues for back precautions; no AD    Balance                                           ADL either performed or assessed with clinical judgement   ADL Overall ADL's : Needs assistance/impaired Eating/Feeding: Independent   Grooming: Oral care;Supervision/safety;Standing   Upper Body Bathing: Set up;Standing   Lower Body Bathing: Minimal assistance;Sit to/from stand   Upper Body Dressing : Set up;Standing   Lower Body Dressing: Minimal assistance;Sit to/from stand   Toilet Transfer: Supervision/safety;Ambulation (comfort height)   Toileting- Clothing Manipulation and Hygiene: Moderate assistance;Sit to/from stand         General ADL Comments: performed ADL in bathroom and reviewed back precautions during adls.  Educated on options for toilet aide     Vision         Perception     Praxis      Pertinent Vitals/Pain Pain Assessment: 0-10 Pain Score: 2  Pain Location: back Pain Descriptors / Indicators: Sore Pain Intervention(s): Limited activity within patient's tolerance;Monitored during session;Premedicated before session;Repositioned     Hand Dominance     Extremity/Trunk  Assessment Upper Extremity Assessment Upper Extremity Assessment: Overall WFL for tasks assessed           Communication Communication Communication: No difficulties   Cognition Arousal/Alertness: Awake/alert Behavior During Therapy: WFL for tasks assessed/performed Overall Cognitive Status: Within Functional Limits for tasks assessed                                     General Comments       Exercises     Shoulder Instructions      Home Living Family/patient expects to be discharged to:: Private residence Living Arrangements: Children;Parent Available Help at Discharge: Family               Bathroom Shower/Tub: Walk-in Soil scientist Toilet: Handicapped height         Additional Comments: has DME at home for Dad; shower seat, possibly 3:1      Prior Functioning/Environment Level of Independence: Independent                 OT Problem List:        OT Treatment/Interventions:      OT Goals(Current goals can be found in the care plan section) Acute Rehab OT Goals Patient Stated  Goal: return to work as Designer, jewellery Goal Formulation: All assessment and education complete, DC therapy  OT Frequency:     Barriers to D/C:            Co-evaluation              AM-PAC PT "6 Clicks" Daily Activity     Outcome Measure Help from another person eating meals?: None Help from another person taking care of personal grooming?: A Little Help from another person toileting, which includes using toliet, bedpan, or urinal?: A Lot Help from another person bathing (including washing, rinsing, drying)?: A Little Help from another person to put on and taking off regular upper body clothing?: A Little Help from another person to put on and taking off regular lower body clothing?: A Little 6 Click Score: 18   End of Session    Activity Tolerance: Patient tolerated treatment well Patient left: in chair;with call bell/phone within  reach  OT Visit Diagnosis: Muscle weakness (generalized) (M62.81)                Time: 1610-9604 OT Time Calculation (min): 30 min Charges:  OT General Charges $OT Visit: 1 Visit OT Evaluation $OT Eval Low Complexity: 1 Low OT Treatments $Self Care/Home Management : 8-22 mins G-Codes: OT G-codes **NOT FOR INPATIENT CLASS** Functional Assessment Tool Used: Clinical judgement;AM-PAC 6 Clicks Daily Activity Functional Limitation: Self care Self Care Current Status (V4098): At least 40 percent but less than 60 percent impaired, limited or restricted Self Care Goal Status (J1914): At least 40 percent but less than 60 percent impaired, limited or restricted Self Care Discharge Status [phone]): At least 40 percent but less than 60 percent impaired, limited or restricted   Marica Otter, OTR/L [phone] 11/30/2016  Kellyn Mansfield 11/30/2016, 11:02 AM

## 2016-11-30 NOTE — Discharge Instructions (Signed)
For the first three days, remove your dressing, and tape a piece of saran wrap over your incision. °Take your shower, then remove the saran wrap and put a clean dressing on. °After three days you can shower without the saran wrap.  °No lifting or bending °No driving while taking pain medications.  °Call Dr. Gioffre if any wound complications or temperature of 101 degrees F or over.  °Call the office for an appointment to see Dr. Gioffre in two weeks: [phone] and ask for Dr. Gioffre's nurse, Tammy Johnson.  °

## 2016-12-01 NOTE — Discharge Summary (Signed)
Physician Discharge Summary   Patient ID: Melissa [name] MRN: [id number] DOB/AGE: 1980-12-09 36 y.o.  Admit date: 11/29/2016 Discharge date: 11/30/2016  Primary Diagnosis: Lumbar disc herniation L5-S1   Admission Diagnoses:  Past Medical History:  Diagnosis Date  . Chronic headaches   . Elevated blood pressure   . GERD (gastroesophageal reflux disease)    Discharge Diagnoses:   Active Problems:   Herniated lumbar intervertebral disc  Estimated body mass index is 35.08 kg/m as calculated from the following:   Height as of this encounter: 5' 7"  (1.702 m).   Weight as of this encounter: 101.6 kg (224 lb).  Procedure:  Procedure(s) (LRB): Hemilaminectomy and microdisectomy; decompression left lateral recess L5-S1, left (Left)   Consults: None  HPI: The patient is a 36 year old female with the chief complaint of low back pain and pain radiating into the left LE. She has been having this issues for months. She failed conservative treatments including activity modification and oral corticosteroids. MRI was ordered and showed a disc herniation at L5-S1 on the left.   Laboratory Data: Hospital Outpatient Visit on 11/17/2016  Component Date Value Ref Range Status  . aPTT 11/17/2016 34  24 - 36 seconds Final  . WBC 11/17/2016 7.5  4.0 - 10.5 K/uL Final  . RBC 11/17/2016 4.52  3.87 - 5.11 MIL/uL Final  . Hemoglobin 11/17/2016 13.0  12.0 - 15.0 g/dL Final  . HCT 11/17/2016 38.4  36.0 - 46.0 % Final  . MCV 11/17/2016 85.0  78.0 - 100.0 fL Final  . MCH 11/17/2016 28.8  26.0 - 34.0 pg Final  . MCHC 11/17/2016 33.9  30.0 - 36.0 g/dL Final  . RDW 11/17/2016 13.1  11.5 - 15.5 % Final  . Platelets 11/17/2016 258  150 - 400 K/uL Final  . Neutrophils Relative % 11/17/2016 61  % Final  . Neutro Abs 11/17/2016 4.6  1.7 - 7.7 K/uL Final  . Lymphocytes Relative 11/17/2016 27  % Final  . Lymphs Abs 11/17/2016 2.0  0.7 - 4.0 K/uL Final  . Monocytes Relative 11/17/2016 6  % Final  . Monocytes  Absolute 11/17/2016 0.5  0.1 - 1.0 K/uL Final  . Eosinophils Relative 11/17/2016 5  % Final  . Eosinophils Absolute 11/17/2016 0.4  0.0 - 0.7 K/uL Final  . Basophils Relative 11/17/2016 1  % Final  . Basophils Absolute 11/17/2016 0.0  0.0 - 0.1 K/uL Final  . Sodium 11/17/2016 139  135 - 145 mmol/L Final  . Potassium 11/17/2016 4.0  3.5 - 5.1 mmol/L Final  . Chloride 11/17/2016 106  101 - 111 mmol/L Final  . CO2 11/17/2016 27  22 - 32 mmol/L Final  . Glucose, Bld 11/17/2016 149* 65 - 99 mg/dL Final  . BUN 11/17/2016 15  6 - 20 mg/dL Final  . Creatinine, Ser 11/17/2016 0.80  0.44 - 1.00 mg/dL Final  . Calcium 11/17/2016 9.1  8.9 - 10.3 mg/dL Final  . Total Protein 11/17/2016 6.9  6.5 - 8.1 g/dL Final  . Albumin 11/17/2016 4.2  3.5 - 5.0 g/dL Final  . AST 11/17/2016 28  15 - 41 U/L Final  . ALT 11/17/2016 23  14 - 54 U/L Final  . Alkaline Phosphatase 11/17/2016 52  38 - 126 U/L Final  . Total Bilirubin 11/17/2016 0.5  0.3 - 1.2 mg/dL Final  . GFR calc non Af Amer 11/17/2016 >60  >60 mL/min Final  . GFR calc Af Amer 11/17/2016 >60  >60 mL/min Final  Comment: (NOTE) The eGFR has been calculated using the CKD EPI equation. This calculation has not been validated in all clinical situations. eGFR's persistently <60 mL/min signify possible Chronic Kidney Disease.   . Anion gap 11/17/2016 6  5 - 15 Final  . Prothrombin Time 11/17/2016 12.6  11.4 - 15.2 seconds Final  . INR 11/17/2016 0.95   Final  . Color, Urine 11/17/2016 YELLOW  YELLOW Final  . APPearance 11/17/2016 HAZY* CLEAR Final  . Specific Gravity, Urine 11/17/2016 1.017  1.005 - 1.030 Final  . pH 11/17/2016 5.0  5.0 - 8.0 Final  . Glucose, UA 11/17/2016 NEGATIVE  NEGATIVE mg/dL Final  . Hgb urine dipstick 11/17/2016 NEGATIVE  NEGATIVE Final  . Bilirubin Urine 11/17/2016 NEGATIVE  NEGATIVE Final  . Ketones, ur 11/17/2016 NEGATIVE  NEGATIVE mg/dL Final  . Protein, ur 11/17/2016 NEGATIVE  NEGATIVE mg/dL Final  . Nitrite 11/17/2016  NEGATIVE  NEGATIVE Final  . Leukocytes, UA 11/17/2016 NEGATIVE  NEGATIVE Final  . MRSA, PCR 11/17/2016 NEGATIVE  NEGATIVE Final  . Staphylococcus aureus 11/17/2016 NEGATIVE  NEGATIVE Final   Comment: (NOTE) The Xpert SA Assay (FDA approved for NASAL specimens in patients 42 years of age and older), is one component of a comprehensive surveillance program. It is not intended to diagnose infection nor to guide or monitor treatment.   . Preg Test, Ur 11/17/2016 NEGATIVE  NEGATIVE Final   Comment:        THE SENSITIVITY OF THIS METHODOLOGY IS >20 mIU/mL.      X-Rays:Dg Chest 2 View  Result Date: 11/17/2016 CLINICAL DATA:  36 year old female preoperative study for lumbar surgery. EXAM: CHEST  2 VIEW COMPARISON:  Right shoulder series 10 04/2012. FINDINGS: Lower cervical disc arthroplasty is evident. Cardiac size is at the upper limits of normal to mildly enlarged. Other mediastinal contours are within normal limits. Visualized tracheal air column is within normal limits. Normal lung volumes. Mild elevation of the right hemidiaphragm. The lungs are clear. No pneumothorax or pleural effusion. Negative visible bowel gas pattern. No acute osseous abnormality identified. IMPRESSION: Borderline to mild cardiomegaly, but no acute cardiopulmonary abnormality. Electronically Signed   By: Genevie Ann M.D.   On: 11/17/2016 15:11   Dg Lumbar Spine 2-3 Views  Result Date: 11/17/2016 CLINICAL DATA:  Preop for lumbar surgery. EXAM: LUMBAR SPINE - 2-3 VIEW COMPARISON:  None. FINDINGS: No fracture or spondylolisthesis is noted. Moderate degenerative disc disease is noted at L3-4. Remaining disc spaces appear intact. IMPRESSION: Moderate degenerative disc disease is noted at L3-4. No acute abnormality seen in the lumbar spine. Electronically Signed   By: Marijo Conception, M.D.   On: 11/17/2016 15:12   Dg Spine Portable 1 View  Result Date: 11/29/2016 CLINICAL DATA:  Lumbar disc disease. EXAM: PORTABLE SPINE - 1 VIEW  COMPARISON:  Radiographs dated 11/17/2016 FINDINGS: There is an instrument at the S1 level of the lumbar spine. IMPRESSION: Instrument at S1 just below the L5-S1 disc space. Electronically Signed   By: Lorriane Shire M.D.   On: 11/29/2016 15:13   Dg Spine Portable 1 View  Result Date: 11/29/2016 CLINICAL DATA:  Surgical localization for lumbar spine surgery EXAM: PORTABLE SPINE - 1 VIEW COMPARISON:  11/29/2016 FINDINGS: Single cross-table lateral view performed. Radiopaque surgical localizers posterior to L5-S1. normal lumbar spine alignment. Preserved vertebral body heights. No other significant finding. IMPRESSION: Operative localization L5-S1 level Electronically Signed   By: Jerilynn Mages.  Shick M.D.   On: 11/29/2016 14:50   Dg Spine Portable  1 View  Result Date: 11/29/2016 CLINICAL DATA:  Intraoperative localization EXAM: PORTABLE SPINE - 1 VIEW COMPARISON:  None. FINDINGS: Single cross-table lateral x-ray of the lumbar spine is provided. Two posterior metallic needles are present. The motor superior needle projects between the posterior margin of the L4-5 spinous process ease. The more inferior needle tip projects just posterior to the L5 spinous process. IMPRESSION: Intraoperative localization as described above. Electronically Signed   By: Kathreen Devoid   On: 11/29/2016 14:35    EKG: Orders placed or performed during the hospital encounter of 11/17/16  . EKG  . EKG     Hospital Course: Havilah Topor is a 36 y.o. who was admitted to [facility]. They were brought to the operating room on 11/29/2016 and underwent Procedure(s): Hemilaminectomy and microdisectomy; decompression left lateral recess L5-S1, left.  Patient tolerated the procedure well and was later transferred to the recovery room and then to the orthopaedic floor for postoperative care.  They were given PO and IV analgesics for pain control following their surgery.  They were given 24 hours of postoperative antibiotics of    Anti-infectives    Start     Dose/Rate Route Frequency Ordered Stop   11/29/16 2000  ceFAZolin (ANCEF) IVPB 1 g/50 mL premix  Status:  Discontinued     1 g 100 mL/hr over 30 Minutes Intravenous Every 8 hours 11/29/16 1723 11/30/16 1353   11/29/16 1425  polymyxin B 500,000 Units, bacitracin 50,000 Units in sodium chloride 0.9 % 500 mL irrigation  Status:  Discontinued       As needed 11/29/16 1425 11/29/16 1550   11/29/16 1133  ceFAZolin (ANCEF) IVPB 2g/100 mL premix     2 g 200 mL/hr over 30 Minutes Intravenous On call to O.R. 11/29/16 1133 11/29/16 1423    PT was ordered to ambulate the patient. Discharge planning consulted to help with postop disposition and equipment needs.  Patient had a good night on the evening of surgery.  They started to get up OOB with therapy on day one. Dressing was changed and the incision was clean and dry.  The patient had progressed with therapy and meeting their goals. Patient was seen in rounds and was ready to go home.   Diet: Cardiac diet Activity:WBAT Follow-up:in 2 weeks Disposition - Home Discharged Condition: stable   Discharge Instructions    Call MD / Call 911    Complete by:  As directed    If you experience chest pain or shortness of breath, CALL 911 and be transported to the hospital emergency room.  If you develope a fever above 101 F, pus (white drainage) or increased drainage or redness at the wound, or calf pain, call your surgeon's office.   Constipation Prevention    Complete by:  As directed    Drink plenty of fluids.  Prune juice may be helpful.  You may use a stool softener, such as Colace (over the counter) 100 mg twice a day.  Use MiraLax (over the counter) for constipation as needed.   Diet - low sodium heart healthy    Complete by:  As directed    Discharge instructions    Complete by:  As directed    For the first three days, remove your dressing, and tape a piece of saran wrap over your incision. Take your shower, then  remove the saran wrap and put a clean dressing on. After three days you can shower without the saran wrap.  No  lifting or bending. No driving while taking pain medications. Call Dr. Gladstone Lighter if any wound complications or temperature of 101 degrees F or over.  Call the office for an appointment to see Dr. Gladstone Lighter in two weeks: [phone] and ask for Dr. Charlestine Night nurse, Brunilda Payor.   Increase activity slowly as tolerated    Complete by:  As directed      Allergies as of 11/30/2016      Reactions   Caffeine Other (See Comments)   Headache   Other Other (See Comments)   Citrus causes migraines      Medication List    STOP taking these medications   cetirizine 10 MG tablet Commonly known as:  ZYRTEC     TAKE these medications   acetaminophen 650 MG CR tablet Commonly known as:  TYLENOL Take 1,300 mg by mouth every 8 (eight) hours as needed for pain. Reported on 05/21/2015   [facility] ALLERGY 180 MG tablet Generic drug:  fexofenadine Take 180 mg by mouth daily. Every am.   b complex vitamins tablet Take 2 tablets by mouth daily.   BEE POLLEN PO Take 15 mLs by mouth daily.   BIOFREEZE EX Apply 1 application topically as needed (pain).   DUEXIS 800-26.6 MG Tabs Generic drug:  Ibuprofen-Famotidine Take 1 tablet by mouth 3 (three) times daily.   fluticasone 50 MCG/ACT nasal spray Commonly known as:  FLONASE Place 2 sprays into both nostrils daily as needed for allergies.   methocarbamol 500 MG tablet Commonly known as:  ROBAXIN Take 1 tablet (500 mg total) by mouth every 6 (six) hours as needed for muscle spasms.   Olopatadine HCl 0.6 % Soln Use 2 sprays in each nostril once daily in the evening.   oxyCODONE-acetaminophen 5-325 MG tablet Commonly known as:  PERCOCET/ROXICET Take 1-2 tablets by mouth every 4 (four) hours as needed for moderate pain.   SINGULAIR 10 MG tablet Generic drug:  montelukast Take 10 mg by mouth daily.   trolamine salicylate 10 %  cream Commonly known as:  ASPERCREME Apply 1 application topically as needed for muscle pain.            Discharge Care Instructions        Start     Ordered   11/30/16 0000  oxyCODONE-acetaminophen (PERCOCET/ROXICET) 5-325 MG tablet  Every 4 hours PRN     11/30/16 0710   11/30/16 0000  methocarbamol (ROBAXIN) 500 MG tablet  Every 6 hours PRN     11/30/16 0710   11/30/16 0000  Call MD / Call 911    Comments:  If you experience chest pain or shortness of breath, CALL 911 and be transported to the hospital emergency room.  If you develope a fever above 101 F, pus (white drainage) or increased drainage or redness at the wound, or calf pain, call your surgeon's office.   11/30/16 0710   11/30/16 0000  Diet - low sodium heart healthy     11/30/16 0710   11/30/16 0000  Constipation Prevention    Comments:  Drink plenty of fluids.  Prune juice may be helpful.  You may use a stool softener, such as Colace (over the counter) 100 mg twice a day.  Use MiraLax (over the counter) for constipation as needed.   11/30/16 0710   11/30/16 0000  Increase activity slowly as tolerated     11/30/16 0710   11/30/16 0000  Discharge instructions    Comments:  For the first three days, remove  your dressing, and tape a piece of saran wrap over your incision. Take your shower, then remove the saran wrap and put a clean dressing on. After three days you can shower without the saran wrap.  No lifting or bending. No driving while taking pain medications. Call Dr. Gladstone Lighter if any wound complications or temperature of 101 degrees F or over.  Call the office for an appointment to see Dr. Gladstone Lighter in two weeks: [phone] and ask for Dr. Charlestine Night nurse, Brunilda Payor.   11/30/16 0710     Follow-up Information    Latanya Maudlin, MD. Schedule an appointment as soon as possible for a visit in 2 week(s).   Specialty:  Orthopedic Surgery Contact information: [address] [place]  [address] [phone]           Signed: Ardeen Jourdain, PA-C Orthopaedic Surgery 12/01/2016, 10:08 AM

## 2018-11-23 IMAGING — DX DG CHEST 2V
2 series · 2 of 2 positions shown · non-contrast
Comparison: Right shoulder series [DATE].

CLINICAL DATA: 36-year-old female preoperative study for lumbar
surgery.

EXAM:
CHEST  2 VIEW

[chest pa]
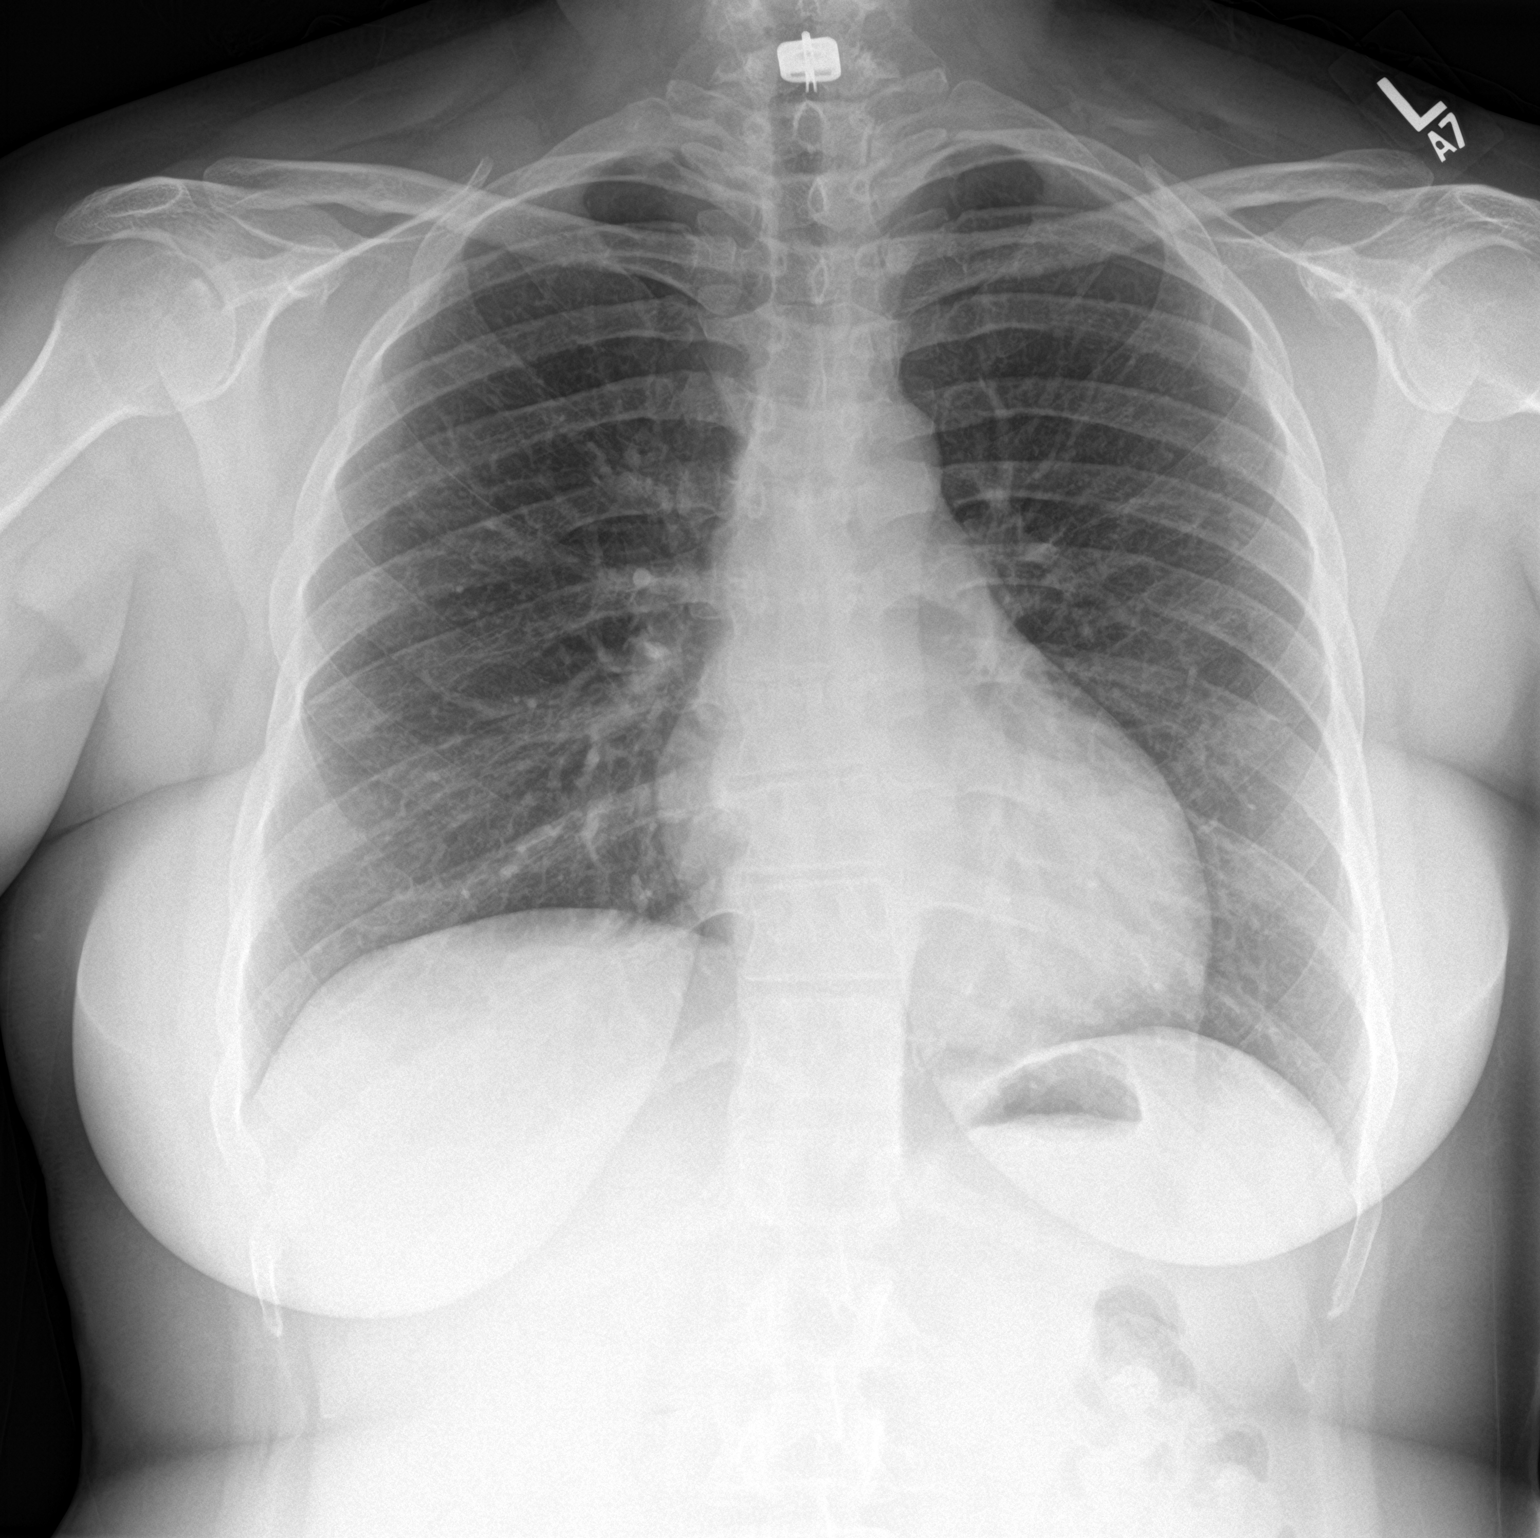

[chest lat]
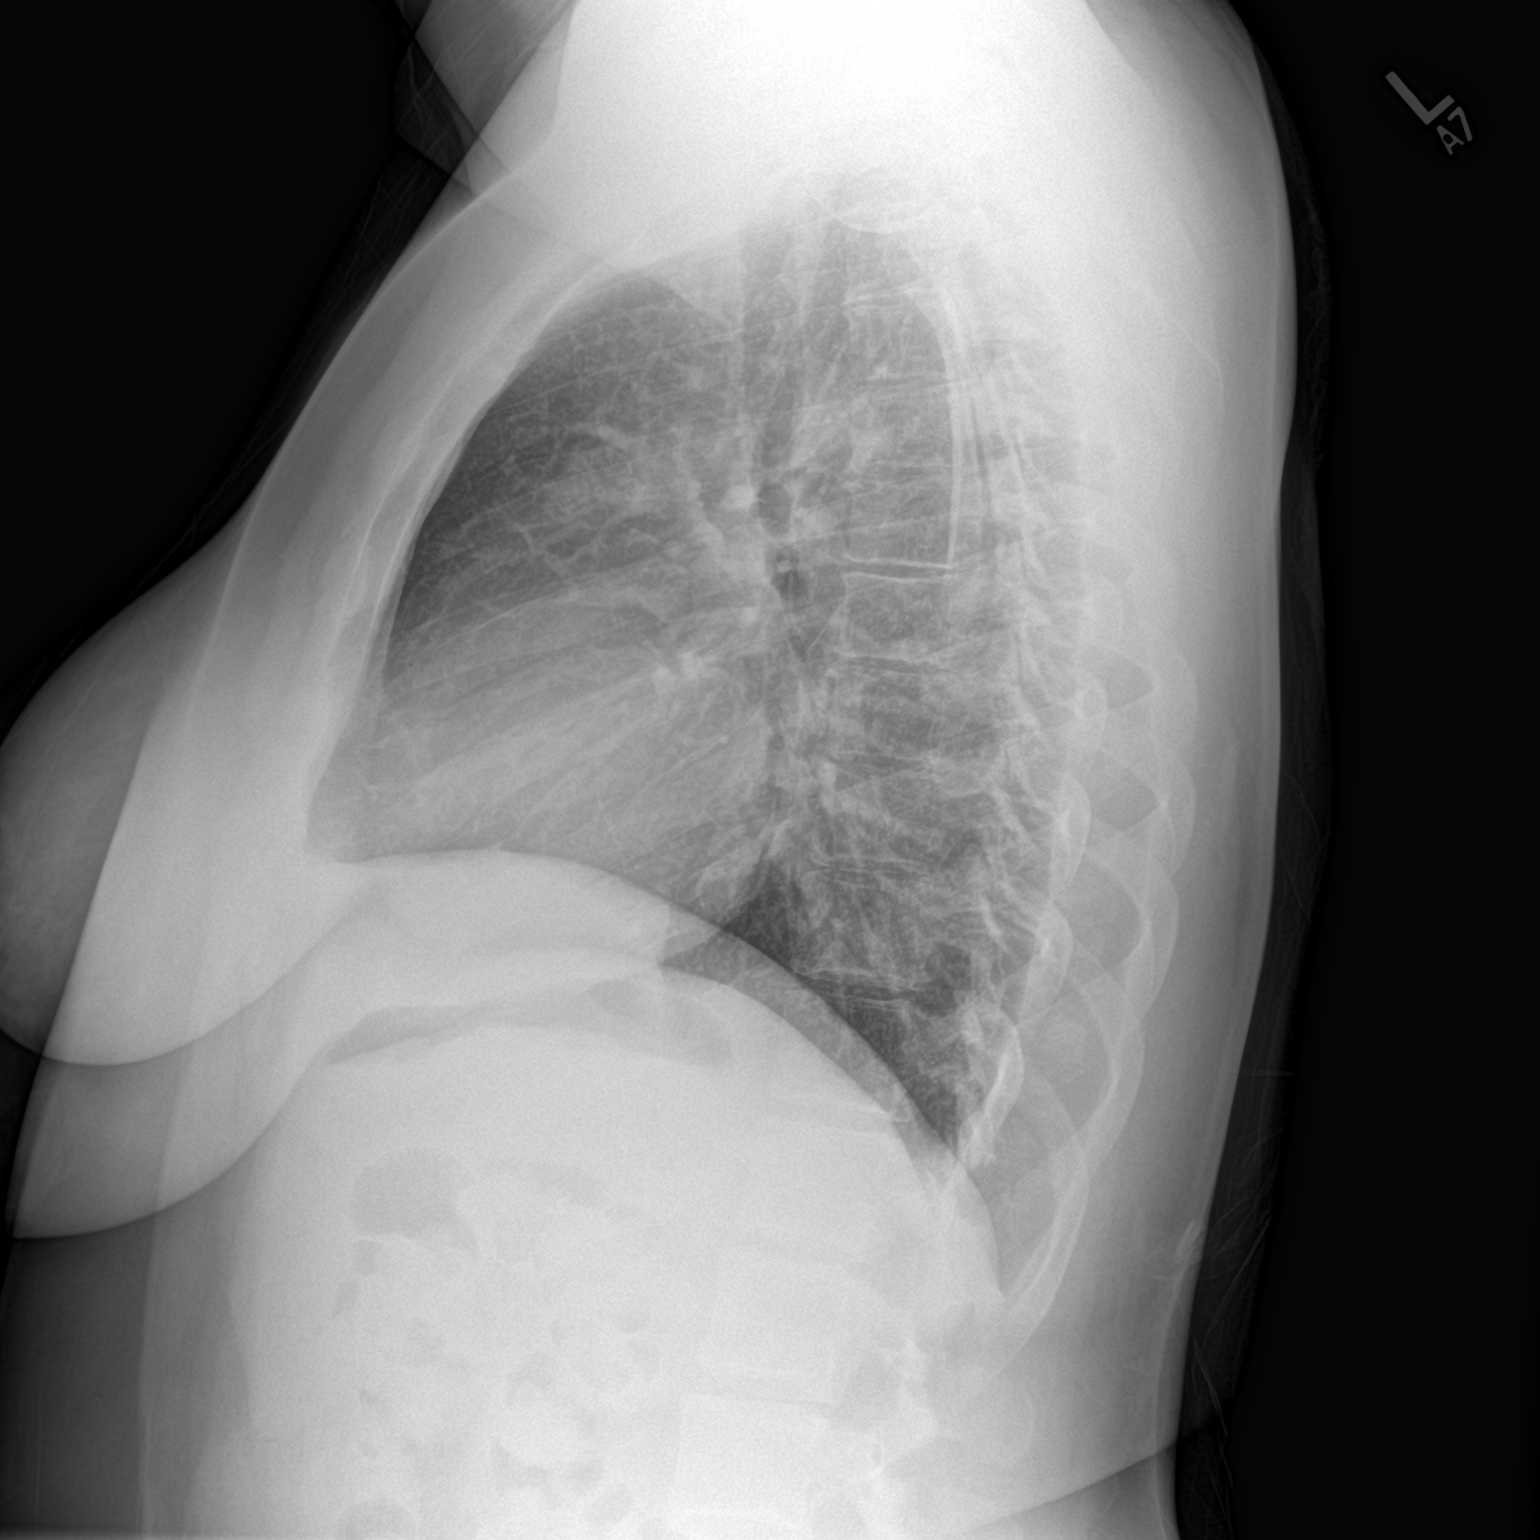

[2 of 2 positions shown; findings below may reference images not displayed]

FINDINGS: Lower cervical disc arthroplasty is evident. Cardiac size is at the
upper limits of normal to mildly enlarged. Other mediastinal
contours are within normal limits. Visualized tracheal air column is
within normal limits. Normal lung volumes. Mild elevation of the
right hemidiaphragm. The lungs are clear. No pneumothorax or pleural
effusion. Negative visible bowel gas pattern. No acute osseous
abnormality identified.
IMPRESSION: Borderline to mild cardiomegaly, but no acute cardiopulmonary
abnormality.

## 2018-11-23 IMAGING — DX DG LUMBAR SPINE 2-3V
2 series · 2 of 2 positions shown · non-contrast
Comparison: None.

CLINICAL DATA: Preop for lumbar surgery.

EXAM:
LUMBAR SPINE - 2-3 VIEW

[l-spine ap]
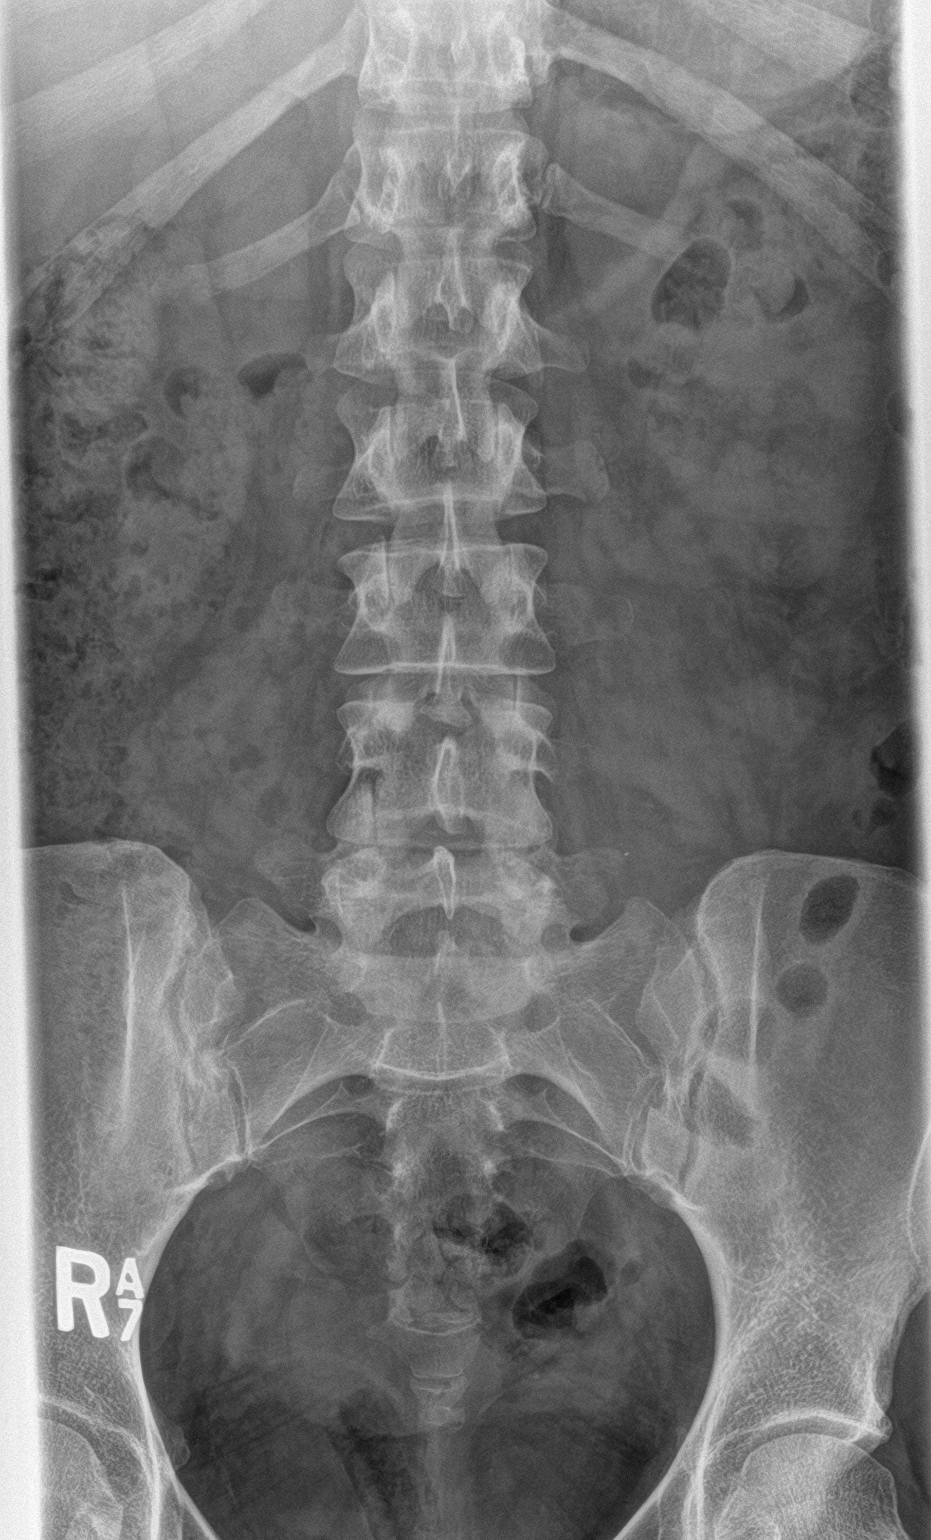

[l-spine lat]
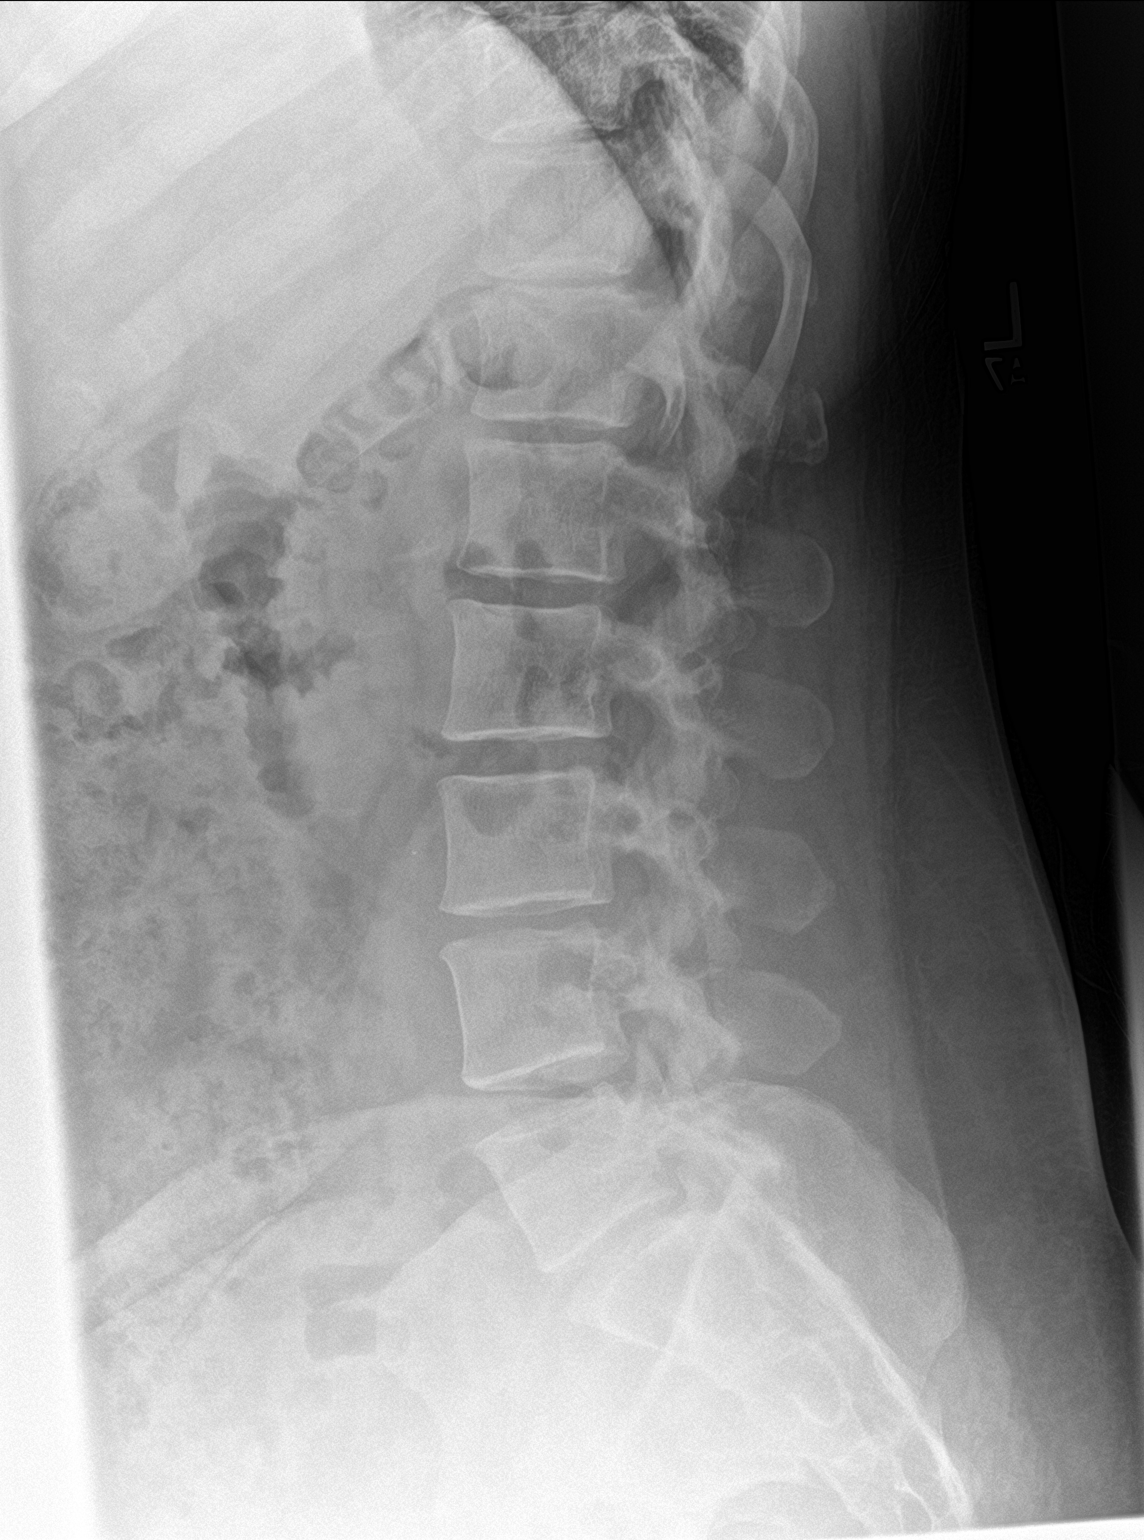

[2 of 2 positions shown; findings below may reference images not displayed]

FINDINGS: No fracture or spondylolisthesis is noted. Moderate degenerative
disc disease is noted at L3-4. Remaining disc spaces appear intact.
IMPRESSION: Moderate degenerative disc disease is noted at L3-4. No acute
abnormality seen in the lumbar spine.

## 2019-11-03 ENCOUNTER — Encounter: Payer: Self-pay | Admitting: Internal Medicine

## 2019-12-22 ENCOUNTER — Ambulatory Visit: Payer: PRIVATE HEALTH INSURANCE | Admitting: Gastroenterology
